# Patient Record
Sex: Female | Born: 1935 | Race: White | Hispanic: No | Smoking: Never smoker
Health system: Southern US, Community
[De-identification: ages and names within clinical notes are randomized; demographics above are authoritative.]

## PROBLEM LIST (undated history)

## (undated) DIAGNOSIS — M1990 Unspecified osteoarthritis, unspecified site: Secondary | ICD-10-CM

## (undated) DIAGNOSIS — F0280 Dementia in other diseases classified elsewhere without behavioral disturbance: Secondary | ICD-10-CM

## (undated) DIAGNOSIS — G309 Alzheimer's disease, unspecified: Secondary | ICD-10-CM

## (undated) DIAGNOSIS — H9190 Unspecified hearing loss, unspecified ear: Secondary | ICD-10-CM

## (undated) DIAGNOSIS — E785 Hyperlipidemia, unspecified: Secondary | ICD-10-CM

## (undated) DIAGNOSIS — E119 Type 2 diabetes mellitus without complications: Secondary | ICD-10-CM

## (undated) DIAGNOSIS — I517 Cardiomegaly: Secondary | ICD-10-CM

## (undated) DIAGNOSIS — F329 Major depressive disorder, single episode, unspecified: Secondary | ICD-10-CM

## (undated) DIAGNOSIS — N6019 Diffuse cystic mastopathy of unspecified breast: Secondary | ICD-10-CM

## (undated) DIAGNOSIS — I10 Essential (primary) hypertension: Secondary | ICD-10-CM

## (undated) DIAGNOSIS — F32A Depression, unspecified: Secondary | ICD-10-CM

## (undated) DIAGNOSIS — R42 Dizziness and giddiness: Secondary | ICD-10-CM

## (undated) DIAGNOSIS — N189 Chronic kidney disease, unspecified: Secondary | ICD-10-CM

## (undated) HISTORY — DX: Alzheimer's disease, unspecified: G30.9

## (undated) HISTORY — PX: CHOLECYSTECTOMY: SHX55

## (undated) HISTORY — DX: Unspecified hearing loss, unspecified ear: H91.90

## (undated) HISTORY — DX: Dementia in other diseases classified elsewhere, unspecified severity, without behavioral disturbance, psychotic disturbance, mood disturbance, and anxiety: F02.80

## (undated) HISTORY — PX: CATARACT EXTRACTION: SUR2

## (undated) HISTORY — DX: Unspecified osteoarthritis, unspecified site: M19.90

## (undated) HISTORY — DX: Hyperlipidemia, unspecified: E78.5

## (undated) HISTORY — PX: OTHER SURGICAL HISTORY: SHX169

## (undated) HISTORY — PX: CARPAL TUNNEL RELEASE: SHX101

## (undated) HISTORY — DX: Cardiomegaly: I51.7

## (undated) HISTORY — PX: BREAST SURGERY: SHX581

## (undated) HISTORY — DX: Chronic kidney disease, unspecified: N18.9

## (undated) HISTORY — DX: Diffuse cystic mastopathy of unspecified breast: N60.19

## (undated) HISTORY — PX: JOINT REPLACEMENT: SHX530

## (undated) HISTORY — PX: ABDOMINAL HYSTERECTOMY: SHX81

## (undated) HISTORY — DX: Dizziness and giddiness: R42

## (undated) HISTORY — DX: Essential (primary) hypertension: I10

---
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
## 2005-05-27 ENCOUNTER — Ambulatory Visit: Payer: Self-pay | Admitting: Specialist

## 2006-04-28 ENCOUNTER — Ambulatory Visit: Payer: Self-pay | Admitting: General Practice

## 2006-05-12 ENCOUNTER — Ambulatory Visit: Payer: Self-pay | Admitting: Internal Medicine

## 2006-06-10 ENCOUNTER — Ambulatory Visit: Payer: Self-pay | Admitting: General Practice

## 2006-06-10 ENCOUNTER — Other Ambulatory Visit: Payer: Self-pay

## 2006-06-23 ENCOUNTER — Ambulatory Visit: Payer: Self-pay | Admitting: General Practice

## 2006-07-06 ENCOUNTER — Encounter: Payer: Self-pay | Admitting: General Practice

## 2006-07-13 ENCOUNTER — Encounter: Payer: Self-pay | Admitting: General Practice

## 2006-08-13 ENCOUNTER — Encounter: Payer: Self-pay | Admitting: General Practice

## 2006-09-12 ENCOUNTER — Encounter: Payer: Self-pay | Admitting: General Practice

## 2006-12-22 ENCOUNTER — Ambulatory Visit: Payer: Self-pay | Admitting: Gastroenterology

## 2007-08-17 ENCOUNTER — Ambulatory Visit: Payer: Self-pay | Admitting: Internal Medicine

## 2008-01-18 ENCOUNTER — Ambulatory Visit: Payer: Self-pay | Admitting: Internal Medicine

## 2008-09-06 ENCOUNTER — Ambulatory Visit: Payer: Self-pay | Admitting: Surgery

## 2008-09-06 ENCOUNTER — Other Ambulatory Visit: Payer: Self-pay

## 2008-09-12 ENCOUNTER — Inpatient Hospital Stay: Payer: Self-pay | Admitting: Surgery

## 2008-10-17 ENCOUNTER — Ambulatory Visit: Payer: Self-pay | Admitting: Internal Medicine

## 2009-10-27 ENCOUNTER — Ambulatory Visit: Payer: Self-pay | Admitting: Internal Medicine

## 2009-11-19 ENCOUNTER — Ambulatory Visit: Payer: Self-pay | Admitting: Specialist

## 2010-10-28 ENCOUNTER — Ambulatory Visit: Payer: Self-pay | Admitting: Internal Medicine

## 2010-11-13 ENCOUNTER — Emergency Department: Payer: Self-pay | Admitting: Emergency Medicine

## 2011-11-11 ENCOUNTER — Ambulatory Visit: Payer: Self-pay | Admitting: Family Medicine

## 2012-04-14 ENCOUNTER — Encounter: Payer: Self-pay | Admitting: Internal Medicine

## 2012-08-17 ENCOUNTER — Encounter: Payer: Self-pay | Admitting: Unknown Physician Specialty

## 2012-09-12 ENCOUNTER — Encounter: Payer: Self-pay | Admitting: Unknown Physician Specialty

## 2012-10-05 ENCOUNTER — Encounter: Payer: Self-pay | Admitting: Physician Assistant

## 2012-10-13 ENCOUNTER — Encounter: Payer: Self-pay | Admitting: Physician Assistant

## 2015-05-28 ENCOUNTER — Telehealth: Payer: Self-pay

## 2015-05-28 NOTE — Telephone Encounter (Signed)
PT request is OK

## 2015-05-28 NOTE — Telephone Encounter (Signed)
Verbal orders called in 

## 2015-05-28 NOTE — Telephone Encounter (Signed)
Physical Therapist calling for verbal orders for PT Strength. Endurance, gait, balance, transferring 1wk x1 6wk x2  Call back [phone]

## 2015-08-05 DIAGNOSIS — I10 Essential (primary) hypertension: Secondary | ICD-10-CM | POA: Insufficient documentation

## 2015-08-05 DIAGNOSIS — E785 Hyperlipidemia, unspecified: Secondary | ICD-10-CM | POA: Insufficient documentation

## 2015-08-05 DIAGNOSIS — F0280 Dementia in other diseases classified elsewhere without behavioral disturbance: Secondary | ICD-10-CM | POA: Insufficient documentation

## 2015-08-05 DIAGNOSIS — G309 Alzheimer's disease, unspecified: Secondary | ICD-10-CM

## 2015-08-06 ENCOUNTER — Ambulatory Visit (INDEPENDENT_AMBULATORY_CARE_PROVIDER_SITE_OTHER): Payer: Medicare Other | Admitting: Family Medicine

## 2015-08-06 ENCOUNTER — Encounter: Payer: Self-pay | Admitting: Family Medicine

## 2015-08-06 VITALS — BP 160/77 | HR 71 | Temp 98.9°F | Ht 60.5 in | Wt 174.0 lb

## 2015-08-06 DIAGNOSIS — F0280 Dementia in other diseases classified elsewhere without behavioral disturbance: Secondary | ICD-10-CM

## 2015-08-06 DIAGNOSIS — G309 Alzheimer's disease, unspecified: Secondary | ICD-10-CM | POA: Diagnosis not present

## 2015-08-06 DIAGNOSIS — I10 Essential (primary) hypertension: Secondary | ICD-10-CM

## 2015-08-06 NOTE — Progress Notes (Signed)
BP 160/77 mmHg  Pulse 71  Temp(Src) 98.9 F (37.2 C)  Ht 5' 0.5" (1.537 m)  Wt 174 lb (78.926 kg)  BMI 33.41 kg/m2  SpO2 97%   Subjective:    Patient ID: Caroline Wilkerson, female    DOB: [birth year], 81 y.o.   MRN: [id number]  HPI: Caroline Wilkerson is a 81 y.o. female  No chief complaint on file.  patient accompanied by her daughter and son. Help assist with history. Daughter relates patient now having sundowning episodes marked confusion Patient otherwise doing stable has had a spell like confusion as noted above was checked for urinary tract infection and no signs or symptoms were found. Patient otherwise stable on current medications with no complaints.  Relevant past medical, surgical, family and social history reviewed and updated as indicated. Interim medical history since our last visit reviewed. Allergies and medications reviewed and updated.  Review of Systems  Constitutional: Negative.   Respiratory: Negative.   Cardiovascular: Negative.     Per HPI unless specifically indicated above     Objective:    BP 160/77 mmHg  Pulse 71  Temp(Src) 98.9 F (37.2 C)  Ht 5' 0.5" (1.537 m)  Wt 174 lb (78.926 kg)  BMI 33.41 kg/m2  SpO2 97%  Wt Readings from Last 3 Encounters:  08/06/15 174 lb (78.926 kg)  01/21/15 168 lb (76.204 kg)    Physical Exam  Constitutional: She is oriented to person, place, and time. She appears well-developed and well-nourished. No distress.  HENT:  Head: Normocephalic and atraumatic.  Right Ear: Hearing normal.  Left Ear: Hearing normal.  Nose: Nose normal.  Eyes: Conjunctivae and lids are normal. Right eye exhibits no discharge. Left eye exhibits no discharge. No scleral icterus.  Pulmonary/Chest: Effort normal. No respiratory distress.  Musculoskeletal: Normal range of motion.  Neurological: She is alert and oriented to person, place, and time.  Skin: Skin is intact. No rash noted.  Psychiatric: She has a normal mood and affect. Her  speech is normal and behavior is normal. Cognition and memory are normal.    Results for orders placed or performed in visit on 08/06/15  Basic metabolic panel  Result Value Ref Range   Glucose 98 65 - 99 mg/dL   BUN 31 (H) 8 - 27 mg/dL   Creatinine, Ser 5.63 (H) 0.57 - 1.00 mg/dL   GFR calc non Af Amer 46 (L) >59 mL/min/1.73   GFR calc Af Amer 53 (L) >59 mL/min/1.73   BUN/Creatinine Ratio 29 (H) 11 - 26   Sodium 139 134 - 144 mmol/L   Potassium 4.2 3.5 - 5.2 mmol/L   Chloride 97 97 - 108 mmol/L   CO2 27 18 - 29 mmol/L   Calcium 9.1 8.7 - 10.3 mg/dL      Assessment & Plan:   Problem List Items Addressed This Visit      Cardiovascular and Mediastinum   Hypertension - Primary    The current medical regimen is effective;  continue present plan and medications.       Relevant Orders   Basic metabolic panel (Completed)     Nervous and Auditory   Alzheimer's dementia    Discussed with family Support, natural progression, lack of adequate medications to control symptoms, but potential for medication anyway if needed.          Follow up plan: Return in about 6 months (around 02/06/2016), or if symptoms worsen or fail to improve.

## 2015-08-07 ENCOUNTER — Encounter: Payer: Self-pay | Admitting: Family Medicine

## 2015-08-07 LAB — BASIC METABOLIC PANEL
BUN/Creatinine Ratio: 29 — ABNORMAL HIGH (ref 11–26)
BUN: 31 mg/dL — AB (ref 8–27)
CO2: 27 mmol/L (ref 18–29)
CREATININE: 1.08 mg/dL — AB (ref 0.57–1.00)
Calcium: 9.1 mg/dL (ref 8.7–10.3)
Chloride: 97 mmol/L (ref 97–108)
GFR calc Af Amer: 53 mL/min/{1.73_m2} — ABNORMAL LOW (ref >59)
GFR calc non Af Amer: 46 mL/min/{1.73_m2} — ABNORMAL LOW (ref >59)
Glucose: 98 mg/dL (ref 65–99)
Potassium: 4.2 mmol/L (ref 3.5–5.2)
Sodium: 139 mmol/L (ref 134–144)

## 2015-08-07 NOTE — Assessment & Plan Note (Signed)
Discussed with family Support, natural progression, lack of adequate medications to control symptoms, but potential for medication anyway if needed.

## 2015-08-07 NOTE — Assessment & Plan Note (Signed)
The current medical regimen is effective;  continue present plan and medications.  

## 2015-08-22 ENCOUNTER — Telehealth: Payer: Self-pay | Admitting: Unknown Physician Specialty

## 2015-08-22 NOTE — Telephone Encounter (Signed)
Caroline Wilkerson from [facility] called stated she needs a verbal order for [facility] for 2 times a week for 3 weeks. Caroline Wilkerson needs this verbal order by the close of business today. Please call ASAP. Caroline Wilkerson pt, Caroline Wilkerson informed MAC out of the office until 09/01/15. Thanks.

## 2015-08-22 NOTE — Telephone Encounter (Signed)
I had called and left Caroline Wilkerson a voicemail for her to call me back as to why they are being sent out. Caroline Wilkerson returned my call and stated that they are being sent out to help the patient with walking, balance, and leg strengthening.

## 2015-08-22 NOTE — Telephone Encounter (Signed)
I can't do this as I have no diagnosis to cover home health.  Can the agency let us know what they are being sent out for?

## 2015-08-22 NOTE — Telephone Encounter (Signed)
I don't see in the chart where Dr. Dossie Arbour ordered this.  Did another dr order?

## 2015-08-26 ENCOUNTER — Telehealth: Payer: Self-pay | Admitting: Family Medicine

## 2015-08-26 NOTE — Telephone Encounter (Signed)
Could not continue therapy as we did not originate order.  Will see if MAC can make referral from last office visit with patient on 08/06/15.

## 2015-08-26 NOTE — Telephone Encounter (Signed)
Tresa Endo from [facility] called stated she needs a continuation order for physical therapy. This was originally ordered through [facility] with Dr. Carola Rhine. Pt is no longer a pt there since switching back to [facility]. Please call and notify if this order can be given. Thanks.

## 2015-09-01 NOTE — Telephone Encounter (Signed)
PT is okay 

## 2015-09-01 NOTE — Telephone Encounter (Signed)
Routing to Dr.Crissman 

## 2015-09-01 NOTE — Telephone Encounter (Signed)
Ok to order 

## 2015-10-09 ENCOUNTER — Telehealth: Payer: Self-pay

## 2015-10-09 MED ORDER — TRIAMTERENE-HCTZ 75-50 MG PO TABS: 1.0000 | ORAL_TABLET | Freq: Every day | ORAL | Status: DC

## 2015-10-09 MED ORDER — MIRABEGRON ER 25 MG PO TB24: 25.0000 mg | ORAL_TABLET | Freq: Every day | ORAL | Status: AC

## 2015-10-09 NOTE — Telephone Encounter (Signed)
[facility] requesting hardcopy Rx's for  Triameterene/Hydrochlorothiazide and [employer]  Fax [phone]

## 2015-10-15 ENCOUNTER — Telehealth: Payer: Self-pay | Admitting: Family Medicine

## 2015-10-15 NOTE — Telephone Encounter (Signed)
Yes just verbally call that in

## 2015-10-15 NOTE — Telephone Encounter (Signed)
Pt has developed a bed sore on her bottom and she would like to know if she can have a medicated bandage called in to [facility] in [place]

## 2015-10-16 ENCOUNTER — Telehealth: Payer: Self-pay | Admitting: Family Medicine

## 2015-10-16 NOTE — Telephone Encounter (Signed)
Pt.notified

## 2015-10-16 NOTE — Telephone Encounter (Signed)
[facility] [phone]

## 2015-10-16 NOTE — Telephone Encounter (Signed)
[facility] and the only thing they knew of was Duoderm which wouldn't be necessarily the best choice. They will check with the daughter and see if they know of something that they have used in the past.

## 2015-10-16 NOTE — Telephone Encounter (Signed)
[facility] and [facility] will not deliver there. pts [facility] [phone]) send to back up and it will be delivered within an hour.

## 2015-10-17 NOTE — Telephone Encounter (Signed)
Pt had pharmacy call to see if anything can be called in to help with bed sores, pt does not think Duoderm patches will work. Can something be sent in to help this pt. Please call and advise [facility] as to what they can dispense. Thanks.

## 2015-10-20 ENCOUNTER — Telehealth: Payer: Self-pay

## 2015-10-20 NOTE — Telephone Encounter (Signed)
pts daughter called and said the bed sore is still present and no patches had been called in and she is willing to accept the duoderm patches first to try them out.

## 2015-10-20 NOTE — Telephone Encounter (Signed)
Call in what for bed sore?

## 2015-10-21 ENCOUNTER — Ambulatory Visit (INDEPENDENT_AMBULATORY_CARE_PROVIDER_SITE_OTHER): Payer: Medicare Other | Admitting: Family Medicine

## 2015-10-21 ENCOUNTER — Telehealth: Payer: Self-pay | Admitting: Family Medicine

## 2015-10-21 ENCOUNTER — Encounter: Payer: Self-pay | Admitting: Family Medicine

## 2015-10-21 VITALS — BP 129/76 | HR 71 | Ht 59.6 in | Wt 176.0 lb

## 2015-10-21 DIAGNOSIS — L89301 Pressure ulcer of unspecified buttock, stage 1: Secondary | ICD-10-CM | POA: Insufficient documentation

## 2015-10-21 DIAGNOSIS — L89311 Pressure ulcer of right buttock, stage 1: Secondary | ICD-10-CM

## 2015-10-21 MED ORDER — DUODERM CGF DRESSING EX MISC: CUTANEOUS | Status: DC

## 2015-10-21 NOTE — Telephone Encounter (Signed)
[facility] WILL NEED TO MANAGE PTS BED SORE

## 2015-10-21 NOTE — Telephone Encounter (Signed)
If patient is Medicare, she will need face-to-face visit in order to order nurse visit Please call daughter and schedule appointment

## 2015-10-21 NOTE — Assessment & Plan Note (Signed)
Discussed care and treatment with patient's daughter We'll give Duoderm Home health nursing Patient education specialist to daughter about movement doughnut cushion and relieving pressure on this area to allow better circulation

## 2015-10-21 NOTE — Telephone Encounter (Signed)
Relayed MAC's reply, she will consider bringing patient in

## 2015-10-21 NOTE — Telephone Encounter (Signed)
Caroline Wilkerson would like to have nursing go out to evaluate bed sore(L hip/buttock)

## 2015-10-21 NOTE — Progress Notes (Signed)
BP 129/76 mmHg  Pulse 71  Ht 4' 11.6" (1.514 m)  Wt 176 lb (79.833 kg)  BMI 34.83 kg/m2  SpO2 96%   Subjective:    Patient ID: Caroline Wilkerson, female    DOB: 1923/12/04, 81 y.o.   MRN: [id number]  HPI: Caroline Wilkerson is a 81 y.o. female  Chief Complaint  Patient presents with  . bed sore    right side  . hand numbness    left   patient with Alzheimer's dementia sits a lot and is new in a nursing home. Has developed a slight abrasion like a pressure sore on right initial tuberosity area  Patient also complaining of some left hand numbness patient with Alzheimer's very difficult to obtain history patient's daughter is with her is not aware of any specific trauma or timing of problem Patient does have some left knee swelling from old trauma 2 weeks ago from where she fell perhaps striking her hand at that time  Relevant past medical, surgical, family and social history reviewed and updated as indicated. Interim medical history since our last visit reviewed. Allergies and medications reviewed and updated.  Review of Systems  Constitutional: Negative.   Respiratory: Negative.   Cardiovascular: Negative.     Per HPI unless specifically indicated above     Objective:    BP 129/76 mmHg  Pulse 71  Ht 4' 11.6" (1.514 m)  Wt 176 lb (79.833 kg)  BMI 34.83 kg/m2  SpO2 96%  Wt Readings from Last 3 Encounters:  10/21/15 176 lb (79.833 kg)  08/06/15 174 lb (78.926 kg)  01/21/15 168 lb (76.204 kg)    Physical Exam  Constitutional: She appears well-developed and well-nourished. No distress.  HENT:  Head: Normocephalic and atraumatic.  Right Ear: Hearing normal.  Left Ear: Hearing normal.  Nose: Nose normal.  Eyes: Conjunctivae and lids are normal. Right eye exhibits no discharge. Left eye exhibits no discharge. No scleral icterus.  Pulmonary/Chest: Effort normal. No respiratory distress.  Musculoskeletal: Normal range of motion.  Left knee effusion and tenderness left  hand is normal with normal sensation strength and grip will range of motion no tenderness  Neurological: She is alert.  Skin: Skin is intact. No rash noted.  Small abrasion like pressure sure right tibial tuberosity area  Psychiatric: She has a normal mood and affect. Her speech is normal and behavior is normal. Cognition and memory are normal.    Results for orders placed or performed in visit on 08/06/15  Basic metabolic panel  Result Value Ref Range   Glucose 98 65 - 99 mg/dL   BUN 31 (H) 8 - 27 mg/dL   Creatinine, Ser 1.32 (H) 0.57 - 1.00 mg/dL   GFR calc non Af Amer 46 (L) >59 mL/min/1.73   GFR calc Af Amer 53 (L) >59 mL/min/1.73   BUN/Creatinine Ratio 29 (H) 11 - 26   Sodium 139 134 - 144 mmol/L   Potassium 4.2 3.5 - 5.2 mmol/L   Chloride 97 97 - 108 mmol/L   CO2 27 18 - 29 mmol/L   Calcium 9.1 8.7 - 10.3 mg/dL      Assessment & Plan:   Problem List Items Addressed This Visit      Musculoskeletal and Integument   Decubitus ulcer of buttock, stage 1 - Primary    Discussed care and treatment with patient's daughter We'll give Duoderm Home health nursing Patient education specialist to daughter about movement doughnut cushion and relieving pressure on this  area to allow better circulation         For hand and the numbness will observe Follow up plan: Return in about 4 weeks (around 11/18/2015) for Recheck sooner if getting worse.

## 2015-11-18 ENCOUNTER — Other Ambulatory Visit: Payer: Medicare Other

## 2015-11-18 DIAGNOSIS — M545 Low back pain, unspecified: Secondary | ICD-10-CM

## 2015-11-18 DIAGNOSIS — R410 Disorientation, unspecified: Secondary | ICD-10-CM

## 2015-11-18 LAB — UA/M W/RFLX CULTURE, ROUTINE
BILIRUBIN UA: NEGATIVE
GLUCOSE, UA: NEGATIVE
Ketones, UA: NEGATIVE
Leukocytes, UA: NEGATIVE
Nitrite, UA: NEGATIVE
PH UA: 6 (ref 5.0–7.5)
Protein, UA: NEGATIVE
RBC UA: NEGATIVE
SPEC GRAV UA: 1.015 (ref 1.005–1.030)
UUROB: 0.2 mg/dL (ref 0.2–1.0)

## 2015-12-29 DIAGNOSIS — R2681 Unsteadiness on feet: Secondary | ICD-10-CM | POA: Diagnosis not present

## 2015-12-29 DIAGNOSIS — L600 Ingrowing nail: Secondary | ICD-10-CM | POA: Diagnosis not present

## 2015-12-29 DIAGNOSIS — B351 Tinea unguium: Secondary | ICD-10-CM | POA: Diagnosis not present

## 2015-12-29 DIAGNOSIS — M797 Fibromyalgia: Secondary | ICD-10-CM | POA: Diagnosis not present

## 2016-01-27 DIAGNOSIS — Z961 Presence of intraocular lens: Secondary | ICD-10-CM | POA: Diagnosis not present

## 2016-02-10 ENCOUNTER — Encounter: Payer: Self-pay | Admitting: Family Medicine

## 2016-02-10 ENCOUNTER — Ambulatory Visit (INDEPENDENT_AMBULATORY_CARE_PROVIDER_SITE_OTHER): Payer: Medicare HMO | Admitting: Family Medicine

## 2016-02-10 VITALS — BP 131/77 | HR 68 | Temp 97.6°F | Ht 60.0 in | Wt 175.0 lb

## 2016-02-10 DIAGNOSIS — F0280 Dementia in other diseases classified elsewhere without behavioral disturbance: Secondary | ICD-10-CM | POA: Diagnosis not present

## 2016-02-10 DIAGNOSIS — I10 Essential (primary) hypertension: Secondary | ICD-10-CM

## 2016-02-10 DIAGNOSIS — G309 Alzheimer's disease, unspecified: Secondary | ICD-10-CM | POA: Diagnosis not present

## 2016-02-10 DIAGNOSIS — R69 Illness, unspecified: Secondary | ICD-10-CM | POA: Diagnosis not present

## 2016-02-10 NOTE — Progress Notes (Signed)
BP 131/77 mmHg  Pulse 68  Temp(Src) 97.6 F (36.4 C)  Ht 5' (1.524 m)  Wt 175 lb (79.379 kg)  BMI 34.18 kg/m2  SpO2 93%   Subjective:    Patient ID: Caroline Wilkerson, female    DOB: [birth year], 82 y.o.   MRN: [id number]  HPI: Caroline Wilkerson is a 82 y.o. female  Chief Complaint  Patient presents with  . Hypertension  . Back Pain    chronic   patient here with son who provides history has really been doing well these last several weeks his back pains been less and has confusion been less area confusion gets worse with back pain getting worse. Patient had a fall about a month ago has been okay concerned about blood pressure being too low and blood pressure medication exacerbating fall risk.   Relevant past medical, surgical, family and social history reviewed and updated as indicated. Interim medical history since our last visit reviewed. Allergies and medications reviewed and updated.  Review of Systems  Constitutional: Negative.   Respiratory: Negative.   Cardiovascular: Negative.     Per HPI unless specifically indicated above     Objective:    BP 131/77 mmHg  Pulse 68  Temp(Src) 97.6 F (36.4 C)  Ht 5' (1.524 m)  Wt 175 lb (79.379 kg)  BMI 34.18 kg/m2  SpO2 93%  Wt Readings from Last 3 Encounters:  02/10/16 175 lb (79.379 kg)  10/21/15 176 lb (79.833 kg)  08/06/15 174 lb (78.926 kg)    Physical Exam  Constitutional: She is oriented to person, place, and time. She appears well-developed and well-nourished. No distress.  HENT:  Head: Normocephalic and atraumatic.  Right Ear: Hearing normal.  Left Ear: Hearing normal.  Nose: Nose normal.  Eyes: Conjunctivae and lids are normal. Right eye exhibits no discharge. Left eye exhibits no discharge. No scleral icterus.  Pulmonary/Chest: Effort normal. No respiratory distress.  Musculoskeletal: Normal range of motion.  Using a rolling walker  Neurological: She is alert and oriented to person, place, and time.   Skin: Skin is intact. No rash noted.  Psychiatric: She has a normal mood and affect. Her speech is normal and behavior is normal. Cognition and memory are normal.    Results for orders placed or performed in visit on 11/18/15  UA/M w/rflx Culture, Routine  Result Value Ref Range   Specific Gravity, UA 1.015 1.005 - 1.030   pH, UA 6.0 5.0 - 7.5   Color, UA Yellow Yellow   Appearance Ur Cloudy (A) Clear   Leukocytes, UA Negative Negative   Protein, UA Negative Negative/Trace   Glucose, UA Negative Negative   Ketones, UA Negative Negative   RBC, UA Negative Negative   Bilirubin, UA Negative Negative   Urobilinogen, Ur 0.2 0.2 - 1.0 mg/dL   Nitrite, UA Negative Negative      Assessment & Plan:   Problem List Items Addressed This Visit      Cardiovascular and Mediastinum   Hypertension - Primary    Good control of blood pressure here in the office today but concerned may be exacerbating risk factors will discontinue blood pressure medicines for now observe blood pressure if going too high will reconsider restarting medication or some other medication.        Nervous and Auditory   Alzheimer's dementia    Stable for now          Follow up plan: Return in about 2 months (around 04/09/2016).

## 2016-02-10 NOTE — Assessment & Plan Note (Signed)
Good control of blood pressure here in the office today but concerned may be exacerbating risk factors will discontinue blood pressure medicines for now observe blood pressure if going too high will reconsider restarting medication or some other medication.

## 2016-02-10 NOTE — Assessment & Plan Note (Signed)
Stable for now

## 2016-02-11 ENCOUNTER — Emergency Department
Admission: EM | Admit: 2016-02-11 | Discharge: 2016-02-11 | Disposition: A | Discharge status: Home / Self Care | Payer: Medicare HMO | Attending: Emergency Medicine | Admitting: Emergency Medicine

## 2016-02-11 ENCOUNTER — Emergency Department: Payer: Medicare HMO

## 2016-02-11 DIAGNOSIS — Y92128 Other place in nursing home as the place of occurrence of the external cause: Secondary | ICD-10-CM | POA: Diagnosis not present

## 2016-02-11 DIAGNOSIS — W01198A Fall on same level from slipping, tripping and stumbling with subsequent striking against other object, initial encounter: Secondary | ICD-10-CM | POA: Insufficient documentation

## 2016-02-11 DIAGNOSIS — Z791 Long term (current) use of non-steroidal anti-inflammatories (NSAID): Secondary | ICD-10-CM | POA: Diagnosis not present

## 2016-02-11 DIAGNOSIS — I129 Hypertensive chronic kidney disease with stage 1 through stage 4 chronic kidney disease, or unspecified chronic kidney disease: Secondary | ICD-10-CM | POA: Insufficient documentation

## 2016-02-11 DIAGNOSIS — F0280 Dementia in other diseases classified elsewhere without behavioral disturbance: Secondary | ICD-10-CM | POA: Diagnosis not present

## 2016-02-11 DIAGNOSIS — Z79899 Other long term (current) drug therapy: Secondary | ICD-10-CM | POA: Diagnosis not present

## 2016-02-11 DIAGNOSIS — N189 Chronic kidney disease, unspecified: Secondary | ICD-10-CM | POA: Insufficient documentation

## 2016-02-11 DIAGNOSIS — Y9389 Activity, other specified: Secondary | ICD-10-CM | POA: Diagnosis not present

## 2016-02-11 DIAGNOSIS — R69 Illness, unspecified: Secondary | ICD-10-CM | POA: Diagnosis not present

## 2016-02-11 DIAGNOSIS — S0990XA Unspecified injury of head, initial encounter: Secondary | ICD-10-CM

## 2016-02-11 DIAGNOSIS — S299XXA Unspecified injury of thorax, initial encounter: Secondary | ICD-10-CM | POA: Diagnosis not present

## 2016-02-11 DIAGNOSIS — S3993XA Unspecified injury of pelvis, initial encounter: Secondary | ICD-10-CM | POA: Diagnosis not present

## 2016-02-11 DIAGNOSIS — G309 Alzheimer's disease, unspecified: Secondary | ICD-10-CM | POA: Insufficient documentation

## 2016-02-11 DIAGNOSIS — Y998 Other external cause status: Secondary | ICD-10-CM | POA: Diagnosis not present

## 2016-02-11 DIAGNOSIS — W19XXXA Unspecified fall, initial encounter: Secondary | ICD-10-CM

## 2016-02-11 DIAGNOSIS — M6281 Muscle weakness (generalized): Secondary | ICD-10-CM | POA: Diagnosis not present

## 2016-02-11 DIAGNOSIS — S0003XA Contusion of scalp, initial encounter: Secondary | ICD-10-CM | POA: Diagnosis not present

## 2016-02-11 LAB — COMPREHENSIVE METABOLIC PANEL
ALK PHOS: 93 U/L (ref 38–126)
ALT: 13 U/L — AB (ref 14–54)
AST: 22 U/L (ref 15–41)
Albumin: 4.2 g/dL (ref 3.5–5.0)
Anion gap: 8 (ref 5–15)
BILIRUBIN TOTAL: 1.1 mg/dL (ref 0.3–1.2)
BUN: 21 mg/dL — ABNORMAL HIGH (ref 6–20)
CALCIUM: 9.3 mg/dL (ref 8.9–10.3)
CO2: 31 mmol/L (ref 22–32)
CREATININE: 0.88 mg/dL (ref 0.44–1.00)
Chloride: 101 mmol/L (ref 101–111)
GFR, EST NON AFRICAN AMERICAN: 56 mL/min — AB (ref >60)
Glucose, Bld: 104 mg/dL — ABNORMAL HIGH (ref 65–99)
Potassium: 3.2 mmol/L — ABNORMAL LOW (ref 3.5–5.1)
Sodium: 140 mmol/L (ref 135–145)
TOTAL PROTEIN: 7.4 g/dL (ref 6.5–8.1)

## 2016-02-11 LAB — URINALYSIS COMPLETE WITH MICROSCOPIC (ARMC ONLY)
BACTERIA UA: NONE SEEN
BILIRUBIN URINE: NEGATIVE
GLUCOSE, UA: NEGATIVE mg/dL
Hgb urine dipstick: NEGATIVE
KETONES UR: NEGATIVE mg/dL
LEUKOCYTES UA: NEGATIVE
Nitrite: NEGATIVE
Protein, ur: NEGATIVE mg/dL
Specific Gravity, Urine: 1.01 (ref 1.005–1.030)
pH: 6 (ref 5.0–8.0)

## 2016-02-11 LAB — CBC WITH DIFFERENTIAL/PLATELET
Basophils Absolute: 0.1 10*3/uL (ref 0–0.1)
Basophils Relative: 1 %
Eosinophils Absolute: 0.2 10*3/uL (ref 0–0.7)
Eosinophils Relative: 3 %
HCT: 39 % (ref 35.0–47.0)
HEMOGLOBIN: 12.8 g/dL (ref 12.0–16.0)
LYMPHS PCT: 21 %
Lymphs Abs: 1.6 10*3/uL (ref 1.0–3.6)
MCH: 30.2 pg (ref 26.0–34.0)
MCHC: 32.9 g/dL (ref 32.0–36.0)
MCV: 91.8 fL (ref 80.0–100.0)
MONOS PCT: 9 %
Monocytes Absolute: 0.7 10*3/uL (ref 0.2–0.9)
NEUTROS PCT: 66 %
Neutro Abs: 4.8 10*3/uL (ref 1.4–6.5)
Platelets: 198 10*3/uL (ref 150–440)
RBC: 4.25 MIL/uL (ref 3.80–5.20)
RDW: 13 % (ref 11.5–14.5)
WBC: 7.4 10*3/uL (ref 3.6–11.0)

## 2016-02-11 LAB — TROPONIN I

## 2016-02-11 MED ORDER — SODIUM CHLORIDE 0.9 % IV BOLUS (SEPSIS)
500.0000 mL | Freq: Once | INTRAVENOUS | Status: AC
  Administered 2016-02-11: 500 mL via INTRAVENOUS

## 2016-02-11 MED ORDER — ACETAMINOPHEN 325 MG PO TABS
650.0000 mg | ORAL_TABLET | Freq: Once | ORAL | Status: AC
  Administered 2016-02-11: 650 mg via ORAL
  Filled 2016-02-11: qty 2

## 2016-02-11 NOTE — ED Notes (Signed)
Pt presents via [facility] after a fall. Pt states she was going to the restroom and fell on the concrete floor, states she hit her head. No palpable hematoma felt. Pt alert and oriented at this time. Pt is not complaining of any pain. Pt states she does not know what made her fall.

## 2016-02-11 NOTE — ED Provider Notes (Signed)
[facility] Emergency Department Provider Note  ____________________________________________  Time seen: Approximately 5:33 AM  I have reviewed the triage vital signs and the nursing notes.   HISTORY  Chief Complaint Fall    HPI Caroline Wilkerson is a 82 y.o. female brought to the ED from nursing facility by EMS s/p unwitnessed fall. Patient reports she was ambulating to the restroom with her walker and fell onto the concrete floor, striking the back of her head. Denies LOC, dizziness, headache, nausea, vomiting, vision changes, neck pain. Patient unsure why she fell; unsure if she tripped. Denies recent fever, chills, cough, congestion, chest pain, shortness of breath, abdominal pain, vomiting, dysuria. Denies recent travel. Nothing makes her symptoms better or worse.   Past Medical History  Diagnosis Date  . Chronic kidney disease   . Vertigo   . Hypertension   . Hyperlipidemia   . Arthritis   . Alzheimer's dementia   . Cardiomegaly   . Hearing loss   . Fibrocystic breast     Patient Active Problem List   Diagnosis Date Noted  . Decubitus ulcer of buttock, stage 1 10/21/2015  . Hypertension   . Hyperlipidemia   . Alzheimer's dementia     Past Surgical History  Procedure Laterality Date  . Abdominal hysterectomy    . Cholecystectomy    . Breast surgery    . Joint replacement Right     knee and hip  . Rotator cuff surgery Bilateral   . Carpal tunnel release    . Cataract extraction Bilateral     Current Outpatient Rx  Name  Route  Sig  Dispense  Refill  . acetaminophen (TYLENOL) 500 MG tablet   Oral   Take 500 mg by mouth every 6 (six) hours as needed.         . calcium carbonate (OS-CAL) 600 MG TABS tablet   Oral   Take 600 mg by mouth 2 (two) times daily with a meal.         . meloxicam (MOBIC) 7.5 MG tablet   Oral   Take 7.5 mg by mouth 2 (two) times daily.         . mirabegron ER (MYRBETRIQ) 25 MG TB24 tablet   Oral  Take 1 tablet (25 mg total) by mouth daily.   30 tablet   6   . omeprazole (PRILOSEC) 20 MG capsule   Oral   Take 20 mg by mouth daily.           Allergies Cocaine and Codeine  Family History  Problem Relation Age of Onset  . Family history unknown: Yes    Social History Social History  Substance Use Topics  . Smoking status: Never Smoker   . Smokeless tobacco: Never Used  . Alcohol Use: No    Review of Systems  Constitutional: Positive for fall with scalp hematoma. No fever/chills. Eyes: No visual changes. ENT: No sore throat. Cardiovascular: Denies chest pain. Respiratory: Denies shortness of breath. Gastrointestinal: No abdominal pain.  No nausea, no vomiting.  No diarrhea.  No constipation. Genitourinary: Negative for dysuria. Musculoskeletal: Negative for back pain. Skin: Negative for rash. Neurological: Negative for headaches, focal weakness or numbness.  10-point ROS otherwise negative.  ____________________________________________   PHYSICAL EXAM:  VITAL SIGNS: ED Triage Vitals  Enc Vitals Group     BP 02/11/16 0439 169/85 mmHg     Pulse Rate 02/11/16 0439 60     Resp 02/11/16 0439 18  Temp 02/11/16 0439 98 F (36.7 C)     Temp src --      SpO2 02/11/16 0439 98 %     Weight 02/11/16 0439 190 lb 11.2 oz (86.5 kg)     Height 02/11/16 0439  (1.651 m)     Head Cir --      Peak Flow --      Pain Score 02/11/16 0440 0     Pain Loc --      Pain Edu? --      Excl. in GC? --     Constitutional: Alert and oriented. Well appearing and in no acute distress. Eyes: Conjunctivae are normal. PERRL. EOMI. Head: Atraumatic. Nose: No congestion/rhinnorhea. Mouth/Throat: Mucous membranes are moist.  Oropharynx non-erythematous. Neck: No stridor.  No cervical spine tenderness to palpation.  No step-offs or deformities noted. Cardiovascular: Normal rate, regular rhythm. II/VI SEM.  Good peripheral circulation. Respiratory: Normal respiratory  effort.  No retractions. Lungs CTAB. Gastrointestinal: Soft and nontender. No distention. No abdominal bruits. No CVA tenderness. Musculoskeletal: No lower extremity tenderness nor edema.  No joint effusions. Neurologic:  Normal speech and language. No gross focal neurologic deficits are appreciated. Gait not tested. Skin:  Skin is warm, dry and intact. No rash noted. Psychiatric: Mood and affect are normal. Speech and behavior are normal.  ____________________________________________   LABS (all labs ordered are listed, but only abnormal results are displayed)  Labs Reviewed  COMPREHENSIVE METABOLIC PANEL - Abnormal; Notable for the following:    Potassium 3.2 (*)    Glucose, Bld 104 (*)    BUN 21 (*)    ALT 13 (*)    GFR calc non Af Amer 56 (*)    All other components within normal limits  CBC WITH DIFFERENTIAL/PLATELET  TROPONIN I  URINALYSIS COMPLETEWITH MICROSCOPIC ([facility] ONLY)   ____________________________________________  EKG  ED ECG REPORT I, Ermalinda Joubert J, the attending physician, personally viewed and interpreted this ECG.   Date: 02/11/2016  EKG Time: 0554  Rate: 63  Rhythm: normal EKG, normal sinus rhythm  Axis: LAD  Intervals:right bundle branch block  ST&T Change:  nonspecific  ____________________________________________  RADIOLOGY  CT head without contrast interpreted per Dr. Cherly Hensen: 1. No evidence of traumatic intracranial injury or fracture.  2. Suggestion of mild soft tissue swelling at the left occiput. 3. Moderate cortical volume loss and scattered small vessel ischemic microangiopathy. 4. Prominent cavum septum pellucidum noted. 5. Inspissated mucus noted filling the left maxillary sinus  ____________________________________________   PROCEDURES  Procedure(s) performed: None  Critical Care performed: No  ____________________________________________   INITIAL IMPRESSION / ASSESSMENT AND PLAN / ED COURSE  Pertinent labs & imaging  results that were available during my care of the patient were reviewed by me and considered in my medical decision making (see chart for details).  82 year old female who presents with occipital scalp hematoma s/p unwitnessed fall with unclear mechanism. Will obtain screening lab work, EKG, urinalysis and imaging studies of head, chest and pelvis.  ----------------------------------------- 8:06 AM on 02/11/2016 -----------------------------------------  Updated patient and family members of imaging results. Awaiting laboratory results and urine specimen. Patient ambulated to commode with steady gait, but felt to obtain a specimen. IV fluids infusing; patient will attempt a second time. Anticipate discharge to her facility pending laboratory and urinalysis results. Care transferred to Dr. Alphonzo Lemmings. ____________________________________________   FINAL CLINICAL IMPRESSION(S) / ED DIAGNOSES  Final diagnoses:  Fall, initial encounter  Scalp contusion, initial encounter  Head injury, initial encounter  Irean Hong, MD 02/11/16 [phone]

## 2016-02-11 NOTE — ED Notes (Signed)
Dr. Sung at bedside.  

## 2016-02-11 NOTE — ED Notes (Signed)
Family at bedside. 

## 2016-02-11 NOTE — Discharge Instructions (Signed)
Return to the ER for worsening symptoms, persistent vomiting, difficulty breathing or other concerns. °

## 2016-02-11 NOTE — ED Notes (Signed)
Pt transported to CT via stretcher.  

## 2016-02-17 ENCOUNTER — Inpatient Hospital Stay
Admission: EM | Admit: 2016-02-17 | Discharge: 2016-02-20 | DRG: 603 | Disposition: A | Discharge status: Home Health | Payer: Medicare HMO | Attending: Internal Medicine | Admitting: Internal Medicine

## 2016-02-17 ENCOUNTER — Encounter: Payer: Self-pay | Admitting: Unknown Physician Specialty

## 2016-02-17 ENCOUNTER — Ambulatory Visit (INDEPENDENT_AMBULATORY_CARE_PROVIDER_SITE_OTHER): Payer: Medicare HMO | Admitting: Unknown Physician Specialty

## 2016-02-17 ENCOUNTER — Encounter: Payer: Self-pay | Admitting: Emergency Medicine

## 2016-02-17 VITALS — BP 111/73 | HR 96 | Temp 98.6°F | Wt 175.0 lb

## 2016-02-17 DIAGNOSIS — Z792 Long term (current) use of antibiotics: Secondary | ICD-10-CM | POA: Diagnosis not present

## 2016-02-17 DIAGNOSIS — Z96651 Presence of right artificial knee joint: Secondary | ICD-10-CM | POA: Diagnosis present

## 2016-02-17 DIAGNOSIS — L0390 Cellulitis, unspecified: Secondary | ICD-10-CM

## 2016-02-17 DIAGNOSIS — G309 Alzheimer's disease, unspecified: Secondary | ICD-10-CM | POA: Diagnosis present

## 2016-02-17 DIAGNOSIS — L03111 Cellulitis of right axilla: Secondary | ICD-10-CM | POA: Diagnosis present

## 2016-02-17 DIAGNOSIS — Y92129 Unspecified place in nursing home as the place of occurrence of the external cause: Secondary | ICD-10-CM | POA: Diagnosis not present

## 2016-02-17 DIAGNOSIS — L03113 Cellulitis of right upper limb: Principal | ICD-10-CM | POA: Diagnosis present

## 2016-02-17 DIAGNOSIS — L02411 Cutaneous abscess of right axilla: Secondary | ICD-10-CM | POA: Diagnosis not present

## 2016-02-17 DIAGNOSIS — W19XXXA Unspecified fall, initial encounter: Secondary | ICD-10-CM | POA: Diagnosis not present

## 2016-02-17 DIAGNOSIS — Z96641 Presence of right artificial hip joint: Secondary | ICD-10-CM | POA: Diagnosis present

## 2016-02-17 DIAGNOSIS — R531 Weakness: Secondary | ICD-10-CM

## 2016-02-17 DIAGNOSIS — B9562 Methicillin resistant Staphylococcus aureus infection as the cause of diseases classified elsewhere: Secondary | ICD-10-CM | POA: Diagnosis not present

## 2016-02-17 DIAGNOSIS — Z8249 Family history of ischemic heart disease and other diseases of the circulatory system: Secondary | ICD-10-CM

## 2016-02-17 DIAGNOSIS — R51 Headache: Secondary | ICD-10-CM | POA: Diagnosis not present

## 2016-02-17 DIAGNOSIS — S0001XA Abrasion of scalp, initial encounter: Secondary | ICD-10-CM | POA: Diagnosis not present

## 2016-02-17 DIAGNOSIS — S0003XA Contusion of scalp, initial encounter: Secondary | ICD-10-CM | POA: Diagnosis not present

## 2016-02-17 DIAGNOSIS — Z791 Long term (current) use of non-steroidal anti-inflammatories (NSAID): Secondary | ICD-10-CM | POA: Diagnosis not present

## 2016-02-17 DIAGNOSIS — N189 Chronic kidney disease, unspecified: Secondary | ICD-10-CM | POA: Diagnosis present

## 2016-02-17 DIAGNOSIS — Z79899 Other long term (current) drug therapy: Secondary | ICD-10-CM | POA: Diagnosis not present

## 2016-02-17 DIAGNOSIS — E876 Hypokalemia: Secondary | ICD-10-CM | POA: Diagnosis present

## 2016-02-17 DIAGNOSIS — I517 Cardiomegaly: Secondary | ICD-10-CM | POA: Diagnosis not present

## 2016-02-17 DIAGNOSIS — I129 Hypertensive chronic kidney disease with stage 1 through stage 4 chronic kidney disease, or unspecified chronic kidney disease: Secondary | ICD-10-CM | POA: Diagnosis not present

## 2016-02-17 DIAGNOSIS — M1990 Unspecified osteoarthritis, unspecified site: Secondary | ICD-10-CM | POA: Diagnosis present

## 2016-02-17 DIAGNOSIS — N6019 Diffuse cystic mastopathy of unspecified breast: Secondary | ICD-10-CM | POA: Diagnosis present

## 2016-02-17 DIAGNOSIS — E785 Hyperlipidemia, unspecified: Secondary | ICD-10-CM | POA: Diagnosis not present

## 2016-02-17 DIAGNOSIS — Z96649 Presence of unspecified artificial hip joint: Secondary | ICD-10-CM | POA: Diagnosis not present

## 2016-02-17 DIAGNOSIS — S199XXA Unspecified injury of neck, initial encounter: Secondary | ICD-10-CM | POA: Diagnosis not present

## 2016-02-17 DIAGNOSIS — F0280 Dementia in other diseases classified elsewhere without behavioral disturbance: Secondary | ICD-10-CM | POA: Diagnosis present

## 2016-02-17 DIAGNOSIS — H9190 Unspecified hearing loss, unspecified ear: Secondary | ICD-10-CM | POA: Diagnosis present

## 2016-02-17 DIAGNOSIS — Y998 Other external cause status: Secondary | ICD-10-CM | POA: Diagnosis not present

## 2016-02-17 DIAGNOSIS — R69 Illness, unspecified: Secondary | ICD-10-CM | POA: Diagnosis not present

## 2016-02-17 DIAGNOSIS — L732 Hidradenitis suppurativa: Secondary | ICD-10-CM | POA: Diagnosis present

## 2016-02-17 DIAGNOSIS — I10 Essential (primary) hypertension: Secondary | ICD-10-CM | POA: Diagnosis not present

## 2016-02-17 DIAGNOSIS — Z885 Allergy status to narcotic agent status: Secondary | ICD-10-CM | POA: Diagnosis not present

## 2016-02-17 DIAGNOSIS — Y9389 Activity, other specified: Secondary | ICD-10-CM | POA: Diagnosis not present

## 2016-02-17 DIAGNOSIS — Z888 Allergy status to other drugs, medicaments and biological substances status: Secondary | ICD-10-CM

## 2016-02-17 DIAGNOSIS — Q659 Congenital deformity of hip, unspecified: Secondary | ICD-10-CM | POA: Diagnosis not present

## 2016-02-17 DIAGNOSIS — Z833 Family history of diabetes mellitus: Secondary | ICD-10-CM | POA: Diagnosis not present

## 2016-02-17 DIAGNOSIS — S0990XA Unspecified injury of head, initial encounter: Secondary | ICD-10-CM | POA: Diagnosis not present

## 2016-02-17 DIAGNOSIS — W01198A Fall on same level from slipping, tripping and stumbling with subsequent striking against other object, initial encounter: Secondary | ICD-10-CM | POA: Diagnosis not present

## 2016-02-17 LAB — CBC
HCT: 41.2 % (ref 35.0–47.0)
Hemoglobin: 13.9 g/dL (ref 12.0–16.0)
MCH: 30.6 pg (ref 26.0–34.0)
MCHC: 33.8 g/dL (ref 32.0–36.0)
MCV: 90.5 fL (ref 80.0–100.0)
PLATELETS: 189 10*3/uL (ref 150–440)
RBC: 4.55 MIL/uL (ref 3.80–5.20)
RDW: 12.9 % (ref 11.5–14.5)
WBC: 18.6 10*3/uL — ABNORMAL HIGH (ref 3.6–11.0)

## 2016-02-17 LAB — URINALYSIS COMPLETE WITH MICROSCOPIC (ARMC ONLY)
BACTERIA UA: NONE SEEN
Bilirubin Urine: NEGATIVE
Glucose, UA: NEGATIVE mg/dL
HGB URINE DIPSTICK: NEGATIVE
Ketones, ur: NEGATIVE mg/dL
LEUKOCYTES UA: NEGATIVE
NITRITE: NEGATIVE
PH: 6 (ref 5.0–8.0)
PROTEIN: 30 mg/dL — AB
SPECIFIC GRAVITY, URINE: 1.02 (ref 1.005–1.030)

## 2016-02-17 LAB — BASIC METABOLIC PANEL
Anion gap: 11 (ref 5–15)
BUN: 26 mg/dL — ABNORMAL HIGH (ref 6–20)
CALCIUM: 9.4 mg/dL (ref 8.9–10.3)
CO2: 28 mmol/L (ref 22–32)
CREATININE: 0.94 mg/dL (ref 0.44–1.00)
Chloride: 98 mmol/L — ABNORMAL LOW (ref 101–111)
GFR, EST NON AFRICAN AMERICAN: 52 mL/min — AB (ref >60)
Glucose, Bld: 126 mg/dL — ABNORMAL HIGH (ref 65–99)
Potassium: 3.4 mmol/L — ABNORMAL LOW (ref 3.5–5.1)
SODIUM: 137 mmol/L (ref 135–145)

## 2016-02-17 LAB — MRSA PCR SCREENING: MRSA BY PCR: POSITIVE — AB

## 2016-02-17 LAB — LACTIC ACID, PLASMA
LACTIC ACID, VENOUS: 1.8 mmol/L (ref 0.5–2.0)
LACTIC ACID, VENOUS: 0.9 mmol/L (ref 0.5–2.0)

## 2016-02-17 MED ORDER — SODIUM CHLORIDE 0.9 % IV BOLUS (SEPSIS)
1000.0000 mL | Freq: Once | INTRAVENOUS | Status: AC
  Administered 2016-02-17: 1000 mL via INTRAVENOUS

## 2016-02-17 MED ORDER — ACETAMINOPHEN 500 MG PO TABS: 500.0000 mg | ORAL_TABLET | Freq: Four times a day (QID) | ORAL | Status: DC | PRN

## 2016-02-17 MED ORDER — CHLORHEXIDINE GLUCONATE CLOTH 2 % EX PADS
6.0000 | MEDICATED_PAD | Freq: Every day | CUTANEOUS | Status: DC
  Administered 2016-02-18 – 2016-02-20 (×3): 6 via TOPICAL

## 2016-02-17 MED ORDER — MIRABEGRON ER 25 MG PO TB24
25.0000 mg | ORAL_TABLET | Freq: Every day | ORAL | Status: DC
  Administered 2016-02-17 – 2016-02-20 (×4): 25 mg via ORAL
  Filled 2016-02-17 (×5): qty 1

## 2016-02-17 MED ORDER — ACETAMINOPHEN 325 MG PO TABS
650.0000 mg | ORAL_TABLET | Freq: Four times a day (QID) | ORAL | Status: DC | PRN
  Administered 2016-02-17: 650 mg via ORAL
  Filled 2016-02-17: qty 2

## 2016-02-17 MED ORDER — MUPIROCIN 2 % EX OINT
1.0000 "application " | TOPICAL_OINTMENT | Freq: Two times a day (BID) | CUTANEOUS | Status: DC
  Administered 2016-02-17 – 2016-02-20 (×6): 1 via NASAL
  Filled 2016-02-17: qty 22

## 2016-02-17 MED ORDER — CLINDAMYCIN PHOSPHATE 600 MG/50ML IV SOLN
600.0000 mg | Freq: Once | INTRAVENOUS | Status: AC
  Administered 2016-02-17: 600 mg via INTRAVENOUS
  Filled 2016-02-17: qty 50

## 2016-02-17 MED ORDER — CALCIUM CARBONATE ANTACID 500 MG PO CHEW
500.0000 mg | CHEWABLE_TABLET | Freq: Two times a day (BID) | ORAL | Status: DC
  Administered 2016-02-17 – 2016-02-20 (×6): 500 mg via ORAL
  Filled 2016-02-17 (×7): qty 1

## 2016-02-17 MED ORDER — PANTOPRAZOLE SODIUM 40 MG PO TBEC
40.0000 mg | DELAYED_RELEASE_TABLET | Freq: Every day | ORAL | Status: DC
  Administered 2016-02-17 – 2016-02-20 (×4): 40 mg via ORAL
  Filled 2016-02-17 (×4): qty 1

## 2016-02-17 MED ORDER — HEPARIN SODIUM (PORCINE) 5000 UNIT/ML IJ SOLN
5000.0000 [IU] | Freq: Three times a day (TID) | INTRAMUSCULAR | Status: DC
  Administered 2016-02-17 – 2016-02-18 (×3): 5000 [IU] via SUBCUTANEOUS
  Filled 2016-02-17 (×3): qty 1

## 2016-02-17 MED ORDER — SODIUM CHLORIDE 0.9 % IV SOLN
1.5000 g | Freq: Three times a day (TID) | INTRAVENOUS | Status: DC
  Administered 2016-02-17 – 2016-02-19 (×6): 1.5 g via INTRAVENOUS
  Filled 2016-02-17 (×10): qty 1.5

## 2016-02-17 MED ORDER — MELOXICAM 7.5 MG PO TABS
7.5000 mg | ORAL_TABLET | Freq: Two times a day (BID) | ORAL | Status: DC
  Administered 2016-02-17 – 2016-02-18 (×3): 7.5 mg via ORAL
  Filled 2016-02-17 (×4): qty 1

## 2016-02-17 NOTE — Progress Notes (Signed)
BP 111/73 mmHg  Pulse 96  Temp(Src) 98.6 F (37 C)  Wt 175 lb (79.379 kg)  SpO2 93%   Subjective:    Patient ID: Caroline Wilkerson, female    DOB: 08-12-1923, 82 y.o.   MRN: [id number]  HPI: Caroline NancyDoris K Hanlon is a 82 y.o. female  Chief Complaint  Patient presents with  . Fall    Patient has fell twice. Went to hospital, but everything was fine. Very weak in legs.   . Recurrent Skin Infections    Under the right arm   Pt is here with both son and daughter while in a wheelchair    Cellulitis Pt with cellulitis under the right arm.  This was noticed yesterday after complaints of it being sore.    Weakness Pt with weakness bilateral legs.  Larey SeatFell backwards 1 week ago, hit her head, and was checked at the ER.  Checked out OK.  Weakness noted just yesterday and fell again as getting up to the bathroom by herself.  Seems like she "sat down."  No increased confusion.  Possibly mild changes in behavior  Relevant past medical, surgical, family and social history reviewed and updated as indicated. Interim medical history since our last visit reviewed. Allergies and medications reviewed and updated.  Review of Systems  Per HPI unless specifically indicated above     Objective:    BP 111/73 mmHg  Pulse 96  Temp(Src) 98.6 F (37 C)  Wt 175 lb (79.379 kg)  SpO2 93%  Wt Readings from Last 3 Encounters:  02/17/16 175 lb (79.379 kg)  02/11/16 190 lb 11.2 oz (86.5 kg)  02/10/16 175 lb (79.379 kg)    Physical Exam  Constitutional: She appears well-developed and well-nourished.  Neck: Normal range of motion.  Cardiovascular: Normal rate and normal heart sounds.  Exam reveals no gallop and no friction rub.   No murmur heard. Pulmonary/Chest: Effort normal and breath sounds normal. No respiratory distress.  Musculoskeletal:       Right shoulder: She exhibits decreased strength.  1 plus strength equal bilaterally  Skin: Skin is warm and dry. There is erythema.  Abcess under right arm  with associated erythema in surrounding soft tissues    Results for orders placed or performed during the hospital encounter of 02/11/16  CBC with Differential  Result Value Ref Range   WBC 7.4 3.6 - 11.0 K/uL   RBC 4.25 3.80 - 5.20 MIL/uL   Hemoglobin 12.8 12.0 - 16.0 g/dL   HCT 08.639.0 57.835.0 - 46.947.0 %   MCV 91.8 80.0 - 100.0 fL   MCH 30.2 26.0 - 34.0 pg   MCHC 32.9 32.0 - 36.0 g/dL   RDW 62.913.0 52.811.5 - 41.314.5 %   Platelets 198 150 - 440 K/uL   Neutrophils Relative % 66 %   Neutro Abs 4.8 1.4 - 6.5 K/uL   Lymphocytes Relative 21 %   Lymphs Abs 1.6 1.0 - 3.6 K/uL   Monocytes Relative 9 %   Monocytes Absolute 0.7 0.2 - 0.9 K/uL   Eosinophils Relative 3 %   Eosinophils Absolute 0.2 0 - 0.7 K/uL   Basophils Relative 1 %   Basophils Absolute 0.1 0 - 0.1 K/uL  Comprehensive metabolic panel  Result Value Ref Range   Sodium 140 135 - 145 mmol/L   Potassium 3.2 (L) 3.5 - 5.1 mmol/L   Chloride 101 101 - 111 mmol/L   CO2 31 22 - 32 mmol/L   Glucose, Bld 104 (H)  65 - 99 mg/dL   BUN 21 (H) 6 - 20 mg/dL   Creatinine, Ser 1.91 0.44 - 1.00 mg/dL   Calcium 9.3 8.9 - 47.8 mg/dL   Total Protein 7.4 6.5 - 8.1 g/dL   Albumin 4.2 3.5 - 5.0 g/dL   AST 22 15 - 41 U/L   ALT 13 (L) 14 - 54 U/L   Alkaline Phosphatase 93 38 - 126 U/L   Total Bilirubin 1.1 0.3 - 1.2 mg/dL   GFR calc non Af Amer 56 (L) >60 mL/min   GFR calc Af Amer >60 >60 mL/min   Anion gap 8 5 - 15  Troponin I  Result Value Ref Range   Troponin I <0.03 <0.031 ng/mL  Urinalysis complete, with microscopic ([facility] only)  Result Value Ref Range   Color, Urine YELLOW (A) YELLOW   APPearance HAZY (A) CLEAR   Glucose, UA NEGATIVE NEGATIVE mg/dL   Bilirubin Urine NEGATIVE NEGATIVE   Ketones, ur NEGATIVE NEGATIVE mg/dL   Specific Gravity, Urine 1.010 1.005 - 1.030   Hgb urine dipstick NEGATIVE NEGATIVE   pH 6.0 5.0 - 8.0   Protein, ur NEGATIVE NEGATIVE mg/dL   Nitrite NEGATIVE NEGATIVE   Leukocytes, UA NEGATIVE NEGATIVE   RBC / HPF 0-5 0  - 5 RBC/hpf   WBC, UA 0-5 0 - 5 WBC/hpf   Bacteria, UA NONE SEEN NONE SEEN   Squamous Epithelial / LPF 0-5 (A) NONE SEEN      Assessment & Plan:   Problem List Items Addressed This Visit    None    Visit Diagnoses    Cellulitis of right axilla    -  Primary    Weakness           Suspect weakness related to cellulitis.  Discussed pt with Dr. Dossie Arbour who examined pt.  Will refer to ER for further evaluations due to safety concerns.    Follow up plan: Return for In the ER.

## 2016-02-17 NOTE — NC FL2 (Signed)
  [place] MEDICAID FL2 LEVEL OF CARE SCREENING TOOL     IDENTIFICATION  Patient Name: Caroline NancyDoris K Baxendale Birthdate: [birth year] Sex: female Admission Date (Current Location): 02/17/2016  [facility] [place] [place]   Facility and Address:  [facility], [address], [place], [place] [address]      Provider Number: [id number]  Attending Physician Name and Address:  Altamese DillingVaibhavkumar Vachhani, MD  Relative Name and Phone Number:       Current Level of Care: Hospital Recommended Level of Care: Assisted Living Facility Prior Approval Number:    Date Approved/Denied:   PASRR Number:  [id number])  Discharge Plan: Domiciliary (Rest home)    Current Diagnoses: Patient Active Problem List   Diagnosis Date Noted  . Cellulitis 02/17/2016  . Generalized weakness 02/17/2016  . Decubitus ulcer of buttock, stage 1 10/21/2015  . Hypertension   . Hyperlipidemia   . Alzheimer's dementia     Orientation RESPIRATION BLADDER Height & Weight     Self, Place  Normal Incontinent Weight: 175 lb (79.379 kg) Height:  5\' 1"  (154.9 cm)  BEHAVIORAL SYMPTOMS/MOOD NEUROLOGICAL BOWEL NUTRITION STATUS   (None)  (None) Incontinent Diet (Heart)  AMBULATORY STATUS COMMUNICATION OF NEEDS Skin   Limited Assist Verbally Normal                       Personal Care Assistance Level of Assistance  Feeding, Dressing, Bathing Bathing Assistance: Limited assistance Feeding assistance: Independent       Functional Limitations Info  Sight, Hearing, Speech Sight Info: Adequate Hearing Info: Adequate Speech Info: Adequate    SPECIAL CARE FACTORS FREQUENCY                       Contractures      Additional Factors Info  Code Status, Allergies Code Status Info:  (Full Code) Allergies Info:  (Codeine)           Current Medications (02/17/2016):  This is the current hospital active medication list Current Facility-Administered Medications  Medication  Dose Route Frequency Provider Last Rate Last Dose  . acetaminophen (TYLENOL) tablet 500 mg  500 mg Oral Q6H PRN Altamese DillingVaibhavkumar Vachhani, MD      . acetaminophen (TYLENOL) tablet 650 mg  650 mg Oral Q6H PRN Altamese DillingVaibhavkumar Vachhani, MD   650 mg at 02/17/16 1539  . ampicillin-sulbactam (UNASYN) 1.5 g in sodium chloride 0.9 % 50 mL IVPB  1.5 g Intravenous Q8H Altamese DillingVaibhavkumar Vachhani, MD   1.5 g at 02/17/16 1535  . calcium carbonate (OS-CAL) tablet 600 mg  600 mg Oral BID WC Altamese DillingVaibhavkumar Vachhani, MD      . heparin injection 5,000 Units  5,000 Units Subcutaneous 3 times per day Altamese DillingVaibhavkumar Vachhani, MD      . meloxicam (MOBIC) tablet 7.5 mg  7.5 mg Oral BID Altamese DillingVaibhavkumar Vachhani, MD      . mirabegron ER (MYRBETRIQ) tablet 25 mg  25 mg Oral Daily Altamese DillingVaibhavkumar Vachhani, MD      . pantoprazole (PROTONIX) EC tablet 40 mg  40 mg Oral Daily Altamese DillingVaibhavkumar Vachhani, MD         Discharge Medications: Please see discharge summary for a list of discharge medications.  Relevant Imaging Results:  Relevant Lab Results:   Additional Information  (SSN [id number])  Verta Ellenhristina E Viveka Wilmeth, LCSW

## 2016-02-17 NOTE — ED Provider Notes (Signed)
[facility] Emergency Department Provider Note   ____________________________________________  Time seen: Approximately 11:30 AM I have reviewed the triage vital signs and the triage nursing note.  HISTORY  Chief Complaint Weakness and Abscess   Historian Patient  HPI Caroline Wilkerson is a 82 y.o. female who [facility] and sent to the ED for evaluation of abscess/cellulitis under the right arm. Patient states that she noticed it yesterday, and told her family today. She's also had extreme generalized weakness, difficulty standing due to generalized weakness. No focal weakness. No headache, nausea, or diarrhea. No abdominal pain. No fever. No trouble breathing. Her skin infections. No history of cancers.    Past Medical History  Diagnosis Date  . Chronic kidney disease   . Vertigo   . Hypertension   . Hyperlipidemia   . Arthritis   . Alzheimer's dementia   . Cardiomegaly   . Hearing loss   . Fibrocystic breast     Patient Active Problem List   Diagnosis Date Noted  . Decubitus ulcer of buttock, stage 1 10/21/2015  . Hypertension   . Hyperlipidemia   . Alzheimer's dementia     Past Surgical History  Procedure Laterality Date  . Abdominal hysterectomy    . Cholecystectomy    . Breast surgery    . Joint replacement Right     knee and hip  . Rotator cuff surgery Bilateral   . Carpal tunnel release    . Cataract extraction Bilateral     Current Outpatient Rx  Name  Route  Sig  Dispense  Refill  . acetaminophen (TYLENOL) 500 MG tablet   Oral   Take 500 mg by mouth every 6 (six) hours as needed for moderate pain.          . calcium carbonate (OS-CAL) 600 MG TABS tablet   Oral   Take 600 mg by mouth 2 (two) times daily with a meal.         . meloxicam (MOBIC) 7.5 MG tablet   Oral   Take 7.5 mg by mouth 2 (two) times daily.         . mirabegron ER (MYRBETRIQ) 25 MG TB24 tablet   Oral   Take 1 tablet (25  mg total) by mouth daily.   30 tablet   6   . omeprazole (PRILOSEC) 20 MG capsule   Oral   Take 20 mg by mouth daily.           Allergies Codeine  Family History  Problem Relation Age of Onset  . Family history unknown: Yes    Social History Social History  Substance Use Topics  . Smoking status: Never Smoker   . Smokeless tobacco: Never Used  . Alcohol Use: No    Review of Systems  Constitutional: Negative for fever. Eyes: Negative for visual changes. ENT: Negative for sore throat. Cardiovascular: Negative for chest pain. Respiratory: Negative for shortness of breath. Gastrointestinal: Negative for abdominal pain, vomiting and diarrhea. Genitourinary: Negative for dysuria. Musculoskeletal: Negative for back pain. Skin: as per hpi. Neurological: Negative for headache. 10 point Review of Systems otherwise negative ____________________________________________   PHYSICAL EXAM:  VITAL SIGNS: ED Triage Vitals  Enc Vitals Group     BP 02/17/16 1018 115/62 mmHg     Pulse Rate 02/17/16 1018 89     Resp 02/17/16 1018 16     Temp 02/17/16 1018 101.2 F (38.4 C)     Temp Source  02/17/16 1110 Oral     SpO2 02/17/16 1018 92 %     Weight 02/17/16 1015 175 lb (79.379 kg)     Height 02/17/16 1015  (1.549 m)     Head Cir --      Peak Flow --      Pain Score --      Pain Loc --      Pain Edu? --      Excl. in GC? --      Constitutional: Alert and oriented. Well appearing and in no distress. HEENT   Head: Normocephalic and atraumatic.      Eyes: Conjunctivae are normal. PERRL. Normal extraocular movements.      Ears:         Nose: No congestion/rhinnorhea.   Mouth/Throat: Mucous membranes are moist.   Neck: No stridor. Cardiovascular/Chest: Normal rate, regular rhythm.  No murmurs, rubs, or gallops. Respiratory: Normal respiratory effort without tachypnea nor retractions. Breath sounds are clear and equal bilaterally. No  wheezes/rales/rhonchi. Gastrointestinal: Soft. No distention, no guarding, no rebound. Nontender.    Genitourinary/rectal:Deferred Musculoskeletal: Nontender with normal range of motion in all extremities. No joint effusions.  No lower extremity tenderness.  No edema. Neurologic:  Normal speech and language. No gross or focal neurologic deficits are appreciated. Skin:  Small draining abscess right armpit with surrounding area of induration without any additional area of fluctuance. Very large area of cellulitis starting at the armpit and extending up the arm and down the body. Psychiatric: Mood and affect are normal. Speech and behavior are normal. Patient exhibits appropriate insight and judgment.  ____________________________________________   EKG I, Governor Rooks, MD, the attending physician have personally viewed and interpreted all ECGs.  None ____________________________________________  LABS (pertinent positives/negatives)  White blood count 18.6, hemoglobin 13.9 and platelet count 189 Basic metabolic panel significant for potassium 3.4, BUN 26 and creatinine 0.94 Lactate pending Urinalysis pending  ____________________________________________  RADIOLOGY All Xrays were viewed by me. Imaging interpreted by Radiologist.  None __________________________________________  PROCEDURES  Procedure(s) performed: None  Critical Care performed: None  ____________________________________________   ED COURSE / ASSESSMENT AND PLAN  Pertinent labs & imaging results that were available during my care of the patient were reviewed by me and considered in my medical decision making (see chart for details).   Although patient does not report fevers, she did have a fever today documented to 101.  The small area appears to be draining already without any additional area of fluctuance, and I don't see anything clinically to lance. Patient will be started on clindamycin to cover for  MRSA.  Patient will be admitted to the hospital given Sirs criteria of elevated white blood cell count, fever, with cellulitis.    CONSULTATIONS:   Hospitalist for admission   Patient / Family / Caregiver informed of clinical course, medical decision-making process, and agree with plan.     ___________________________________________   FINAL CLINICAL IMPRESSION(S) / ED DIAGNOSES   Final diagnoses:  Right arm cellulitis              Note: This dictation was prepared with Dragon dictation. Any transcriptional errors that result from this process are unintentional   Governor Rooks, MD 02/17/16 1252

## 2016-02-17 NOTE — ED Notes (Signed)
MD at bedside.  Dr. Shaune PollackLord in room to assess patient and discuss plan of care.  Will continue to monitor.

## 2016-02-17 NOTE — Clinical Social Work Note (Signed)
Clinical Social Work Assessment  Patient Details  Name: Caroline Wilkerson MRN: [id number] Date of Birth: 1923-07-24  Date of referral:  02/17/16               Reason for consult:  Facility Placement, Other (Comment Required) (From [facility] )                Permission sought to share information with:  Chartered certified accountant granted to share information::  Yes, Verbal Permission Granted  Name::      [facility]   Agency::     Relationship::     Contact Information:     Housing/Transportation Living arrangements for the past 2 months:  [place] of Information:  Patient, Adult Children Patient Interpreter Needed:  None Criminal Activity/Legal Involvement Pertinent to Current Situation/Hospitalization:  No - Comment as needed Significant Relationships:  Adult Children Lives with:  Facility Resident Do you feel safe going back to the place where you live?  Yes Need for family participation in patient care:  Yes (Comment)  Care giving concerns:  Patient is a resident at [facility] (fax: [phone])   Social Worker assessment / plan:  Clinical Social Worker ([place]) reviewed chart and noted that patient is from [facility]. CSW contacted [facility] and spoke with [employer]. Per Med Tech patient is private pay and been there for 1 to 2 years. Per Med Tech patient usually walks with a walker at baseline, sometimes needs minimal assistance and is not on oxygen. Per Med Tech patient can return to [facility] when stable.  CSW met with patient and her daughter Caroline Wilkerson [phone] was at bedside. Patient was alert and oriented and was laying in the bed. Patient reported that she is from [facility] and would like to return at D/C. Daughter reported that per MD patient has an infection that is making her weak and once that clears up her strength will return. Daughter prefers patient to return to [facility]. Per daughter patient's son Caroline Wilkerson [phone] is patient's HPOA. FL2 complete.   Employment status:  Retired Nurse, adult PT Recommendations:  Not assessed at this time Information / Referral to community resources:  Other (Comment Required) (ALF VS. SNF )  Patient/Family's Response to care:  Patient and daughter prefer for patient to return to [facility].   Patient/Family's Understanding of and Emotional Response to Diagnosis, Current Treatment, and Prognosis:  Patient was pleasant throughout assessment and thanked CSW for visit.   Emotional Assessment Appearance:  Appears stated age Attitude/Demeanor/Rapport:    Affect (typically observed):  Accepting, Adaptable, Pleasant Orientation:  Oriented to Self, Oriented to Place, Oriented to  Time, Oriented to Situation Alcohol / Substance use:  Not Applicable Psych involvement (Current and /or in the community):  No (Comment)  Discharge Needs  Concerns to be addressed:  Discharge Planning Concerns Readmission within the last 30 days:  No Current discharge risk:  Dependent with Mobility Barriers to Discharge:  Continued Medical Work up   Caroline Freshwater, LCSW 02/17/2016, 4:20 PM

## 2016-02-17 NOTE — Progress Notes (Signed)
Lab called about patient positive MRSA. Patient is now on contact isolation and new standing orders is accessed, will continue to monitor.

## 2016-02-17 NOTE — ED Notes (Signed)
Pt went to dr office and sent here.  Has had cyst under right arm and now has weakness since yesterday.

## 2016-02-17 NOTE — H&P (Signed)
[facility] - [place] at [place]   PATIENT NAME: Caroline Wilkerson    MR#:  [id number]  DATE OF BIRTH:  [birth year]  DATE OF ADMISSION:  02/17/2016  PRIMARY CARE PHYSICIAN: Vonita Moss, MD   REQUESTING/REFERRING PHYSICIAN: Governor Rooks  CHIEF COMPLAINT:   Chief Complaint  Patient presents with  . Weakness  . Abscess    HISTORY OF PRESENT ILLNESS: Caroline Wilkerson  is a 82 y.o. female with a known history of chronic kidney disease, hypertension, hyperlipidemia, Alzheimer's dementia, cardiomegaly, hearing deficit- lives in assisted living. Facility and at her baseline she is able to walk with her walker. Since yesterday she is not able to walk and feeling very weak, when daughter was trying to help her to change her chlorthalidone she noticed in her right basilar there is redness and it was tender to touch. She took her to her primary care doctor's office today and he was concerned with this cellulitis and patient's not able to walk so advised to take her to emergency room for admission.  PAST MEDICAL HISTORY:   Past Medical History  Diagnosis Date  . Chronic kidney disease   . Vertigo   . Hypertension   . Hyperlipidemia   . Arthritis   . Alzheimer's dementia   . Cardiomegaly   . Hearing loss   . Fibrocystic breast     PAST SURGICAL HISTORY: Past Surgical History  Procedure Laterality Date  . Abdominal hysterectomy    . Cholecystectomy    . Breast surgery    . Joint replacement Right     knee and hip  . Rotator cuff surgery Bilateral   . Carpal tunnel release    . Cataract extraction Bilateral     SOCIAL HISTORY:  Social History  Substance Use Topics  . Smoking status: Never Smoker   . Smokeless tobacco: Never Used  . Alcohol Use: No    FAMILY HISTORY:  Family History  Problem Relation Age of Onset  . Hypertension Daughter   . Hypertension Son   . Diabetes Son     DRUG ALLERGIES:  Allergies  Allergen Reactions  . Codeine Diarrhea and  Nausea And Vomiting    REVIEW OF SYSTEMS:   CONSTITUTIONAL: No fever, positive fatigue or weakness.  EYES: No blurred or double vision.  EARS, NOSE, AND THROAT: No tinnitus or ear pain.  RESPIRATORY: No cough, shortness of breath, wheezing or hemoptysis.  CARDIOVASCULAR: No chest pain, orthopnea, edema.  GASTROINTESTINAL: No nausea, vomiting, diarrhea or abdominal pain.  GENITOURINARY: No dysuria, hematuria.  ENDOCRINE: No polyuria, nocturia,  HEMATOLOGY: No anemia, easy bruising or bleeding SKIN: right axilla redness and pain. MUSCULOSKELETAL: No joint pain or arthritis.   NEUROLOGIC: No tingling, numbness, weakness.  PSYCHIATRY: No anxiety or depression.   MEDICATIONS AT HOME:  Prior to Admission medications   Medication Sig Start Date End Date Taking? Authorizing Provider  acetaminophen (TYLENOL) 500 MG tablet Take 500 mg by mouth every 6 (six) hours as needed for moderate pain.    Yes Historical Provider, MD  calcium carbonate (OS-CAL) 600 MG TABS tablet Take 600 mg by mouth 2 (two) times daily with a meal.   Yes Historical Provider, MD  meloxicam (MOBIC) 7.5 MG tablet Take 7.5 mg by mouth 2 (two) times daily.   Yes Historical Provider, MD  mirabegron ER (MYRBETRIQ) 25 MG TB24 tablet Take 1 tablet (25 mg total) by mouth daily. 10/09/15  Yes Steele Sizer, MD  omeprazole (PRILOSEC) 20 MG capsule  Take 20 mg by mouth daily.   Yes Historical Provider, MD      PHYSICAL EXAMINATION:   VITAL SIGNS: Blood pressure 117/86, pulse 82, temperature 98.3 F (36.8 C), temperature source Oral, resp. rate 21, height 5\' 1"  (1.549 m), weight 79.379 kg (175 lb), SpO2 94 %.  GENERAL:  82 y.o.-year-old patient lying in the bed with no acute distress.  EYES: Pupils equal, round, reactive to light and accommodation. No scleral icterus. Extraocular muscles intact.  HEENT: Head atraumatic, normocephalic. Oropharynx and nasopharynx clear. Have hearing deficit. NECK:  Supple, no jugular venous  distention. No thyroid enlargement, no tenderness.  LUNGS: Normal breath sounds bilaterally, no wheezing, rales,rhonchi or crepitation. No use of accessory muscles of respiration.  CARDIOVASCULAR: S1, S2 normal. No murmurs, rubs, or gallops.  ABDOMEN: Soft, nontender, nondistended. Bowel sounds present. No organomegaly or mass.  EXTREMITIES: No pedal edema, cyanosis, or clubbing. Right axilla redness, tender to touch. NEUROLOGIC: Cranial nerves II through XII are intact. Muscle strength 5/5 in all extremities. Sensation intact. Gait not checked.  PSYCHIATRIC: The patient is alert and not well oriented .  SKIN: No obvious rash, lesion, or ulcer.   LABORATORY PANEL:   CBC  Recent Labs Lab 02/11/16 0712 02/17/16 1021  WBC 7.4 18.6*  HGB 12.8 13.9  HCT 39.0 41.2  PLT 198 189  MCV 91.8 90.5  MCH 30.2 30.6  MCHC 32.9 33.8  RDW 13.0 12.9  LYMPHSABS 1.6  --   MONOABS 0.7  --   EOSABS 0.2  --   BASOSABS 0.1  --    ------------------------------------------------------------------------------------------------------------------  Chemistries   Recent Labs Lab 02/11/16 0712 02/17/16 1021  NA 140 137  K 3.2* 3.4*  CL 101 98*  CO2 31 28  GLUCOSE 104* 126*  BUN 21* 26*  CREATININE 0.88 0.94  CALCIUM 9.3 9.4  AST 22  --   ALT 13*  --   ALKPHOS 93  --   BILITOT 1.1  --    ------------------------------------------------------------------------------------------------------------------ estimated creatinine clearance is 37.9 mL/min (by C-G formula based on Cr of 0.94). ------------------------------------------------------------------------------------------------------------------ No results for input(s): TSH, T4TOTAL, T3FREE, THYROIDAB in the last 72 hours.  Invalid input(s): FREET3   Coagulation profile No results for input(s): INR, PROTIME in the last 168  hours. ------------------------------------------------------------------------------------------------------------------- No results for input(s): DDIMER in the last 72 hours. -------------------------------------------------------------------------------------------------------------------  Cardiac Enzymes  Recent Labs Lab 02/11/16 0712  TROPONINI <0.03   ------------------------------------------------------------------------------------------------------------------ Invalid input(s): POCBNP  ---------------------------------------------------------------------------------------------------------------  Urinalysis    Component Value Date/Time   COLORURINE YELLOW* 02/11/2016 0824   APPEARANCEUR HAZY* 02/11/2016 0824   LABSPEC 1.010 02/11/2016 0824   PHURINE 6.0 02/11/2016 0824   GLUCOSEU NEGATIVE 02/11/2016 0824   HGBUR NEGATIVE 02/11/2016 0824   BILIRUBINUR NEGATIVE 02/11/2016 0824   BILIRUBINUR Negative 11/18/2015 1043   KETONESUR NEGATIVE 02/11/2016 0824   PROTEINUR NEGATIVE 02/11/2016 0824   NITRITE NEGATIVE 02/11/2016 0824   NITRITE Negative 11/18/2015 1043   LEUKOCYTESUR NEGATIVE 02/11/2016 0824   LEUKOCYTESUR Negative 11/18/2015 1043     RADIOLOGY: No results found.  EKG: Orders placed or performed during the hospital encounter of 02/11/16  . ED EKG  . ED EKG    IMPRESSION AND PLAN:  * Cellulitis  Blood culture is sent.  We'll give IV Unasyn.  * Generalized weakness  Most likely secondary to cellulitis, treat underlying cause and get physical therapy evaluation.  * Hypertension   As per son she was recently taken off from her blood pressure medicine by PMD  last week, as she had a fall and he suspected that pressure is running lower.   Currently the patient is normal so I will keep her off of any medicine.  * Chronic renal failure   Stable.  All the records are reviewed and case discussed with ED provider. Management plans discussed with the  patient, family and they are in agreement.  CODE STATUS: Code Status History    This patient does not have a recorded code status. Please follow your organizational policy for patients in this situation.     Patient self-report power of attorney is her son who is present in the room at time of admission. Her daughter is also present in the room, I explained the plan to both of them.  TOTAL TIME TAKING CARE OF THIS PATIENT: 50 minutes.    Altamese Dilling M.D on 02/17/2016   Between 7am to 6pm - Pager - [phone]  After 6pm go to [email or web address] - password [email or web address]  [facility]  Office  [phone]  CC: Primary care physician; Vonita Moss, MD   Note: This dictation was prepared with Dragon dictation along with smaller phrase technology. Any transcriptional errors that result from this process are unintentional.

## 2016-02-18 LAB — BASIC METABOLIC PANEL
Anion gap: 9 (ref 5–15)
BUN: 19 mg/dL (ref 6–20)
CALCIUM: 8.3 mg/dL — AB (ref 8.9–10.3)
CO2: 28 mmol/L (ref 22–32)
CREATININE: 0.83 mg/dL (ref 0.44–1.00)
Chloride: 102 mmol/L (ref 101–111)
GFR calc Af Amer: >60 mL/min (ref >60)
GFR calc non Af Amer: >60 mL/min (ref >60)
GLUCOSE: 103 mg/dL — AB (ref 65–99)
Potassium: 2.9 mmol/L — CL (ref 3.5–5.1)
Sodium: 139 mmol/L (ref 135–145)

## 2016-02-18 LAB — MAGNESIUM: MAGNESIUM: 1.6 mg/dL — AB (ref 1.7–2.4)

## 2016-02-18 LAB — CBC
HCT: 34.8 % — ABNORMAL LOW (ref 35.0–47.0)
Hemoglobin: 11.8 g/dL — ABNORMAL LOW (ref 12.0–16.0)
MCH: 31.4 pg (ref 26.0–34.0)
MCHC: 34 g/dL (ref 32.0–36.0)
MCV: 92.5 fL (ref 80.0–100.0)
PLATELETS: 163 10*3/uL (ref 150–440)
RBC: 3.76 MIL/uL — ABNORMAL LOW (ref 3.80–5.20)
RDW: 12.9 % (ref 11.5–14.5)
WBC: 14 10*3/uL — ABNORMAL HIGH (ref 3.6–11.0)

## 2016-02-18 LAB — POTASSIUM: Potassium: 3.7 mmol/L (ref 3.5–5.1)

## 2016-02-18 MED ORDER — MAGNESIUM SULFATE 2 GM/50ML IV SOLN
2.0000 g | Freq: Once | INTRAVENOUS | Status: AC
  Administered 2016-02-18: 2 g via INTRAVENOUS
  Filled 2016-02-18: qty 50

## 2016-02-18 MED ORDER — ENOXAPARIN SODIUM 40 MG/0.4ML ~~LOC~~ SOLN
40.0000 mg | SUBCUTANEOUS | Status: DC
  Administered 2016-02-18 – 2016-02-19 (×2): 40 mg via SUBCUTANEOUS
  Filled 2016-02-18 (×2): qty 0.4

## 2016-02-18 MED ORDER — ACETAMINOPHEN 325 MG PO TABS: 650.0000 mg | ORAL_TABLET | Freq: Four times a day (QID) | ORAL | Status: DC | PRN

## 2016-02-18 MED ORDER — MELOXICAM 7.5 MG PO TABS
7.5000 mg | ORAL_TABLET | Freq: Two times a day (BID) | ORAL | Status: DC | PRN
  Administered 2016-02-18: 7.5 mg via ORAL
  Filled 2016-02-18: qty 1

## 2016-02-18 MED ORDER — POTASSIUM CHLORIDE 10 MEQ/100ML IV SOLN
10.0000 meq | INTRAVENOUS | Status: AC
  Administered 2016-02-18 (×4): 10 meq via INTRAVENOUS
  Filled 2016-02-18 (×4): qty 100

## 2016-02-18 MED ORDER — LORAZEPAM 2 MG/ML IJ SOLN
1.0000 mg | Freq: Once | INTRAMUSCULAR | Status: AC
  Administered 2016-02-18: 1 mg via INTRAVENOUS
  Filled 2016-02-18: qty 1

## 2016-02-18 NOTE — Progress Notes (Addendum)
MEDICATION RELATED CONSULT NOTE - INITIAL   Pharmacy Consult for electrolyte monitoring and replacement Indication: hypokalemia  Allergies  Allergen Reactions  . Codeine Diarrhea and Nausea And Vomiting   Patient Measurements: Height: 5\' 1"  (154.9 cm) Weight: 175 lb (79.379 kg) IBW/kg (Calculated) : 47.8  Vital Signs: Temp: 98.2 F (36.8 C) (03/08 0837) Temp Source: Axillary (03/08 0837) BP: 138/66 mmHg (03/08 0837) Pulse Rate: 64 (03/08 0837) Intake/Output from previous day: 03/07 0701 - 03/08 0700 In: 50 [IV Piggyback:50] Out: -  Intake/Output from this shift:    Labs:  Recent Labs  02/17/16 1021 02/18/16 0552  WBC 18.6* 14.0*  HGB 13.9 11.8*  HCT 41.2 34.8*  PLT 189 163  CREATININE 0.94 0.83   Estimated Creatinine Clearance: 43 mL/min (by C-G formula based on Cr of 0.83).  Medical History: Past Medical History  Diagnosis Date  . Chronic kidney disease   . Vertigo   . Hypertension   . Hyperlipidemia   . Arthritis   . Alzheimer's dementia   . Cardiomegaly   . Hearing loss   . Fibrocystic breast    Assessment: Pharmacy consulted to monitor and replace electrolytes in this 82 year old female for hypokalemia. Patient is not currently on any diuretics.   K = 2.9  Goal of Therapy:  Electrolytes within normal limits  Plan:  Ordered add-on magnesium Ordered KCl 10 mEq IV x 4 runs Will recheck K at 1800 tonight  Thank you for allowing pharmacy to be part of this patient's care.  Cindi CarbonMary M Kaimana Lurz, PharmD Clinical Pharmacist 02/18/2016,9:15 AM   Magnesium was low @ 1.6 - ordered 2 g IV Mg x 1 dose. Will recheck Mg with AM labs tomorrow.  Cindi CarbonMary M Jaquitta Dupriest, PharmD Clinical Pharmacist 02/18/2016 2:28 PM

## 2016-02-18 NOTE — Progress Notes (Addendum)
Patient ID: LABRESHA MELLOR, female   DOB: December 29, 1922, 82 y.o.   MRN: [id number] [facility] [place] [facility]   PATIENT NAME: Caralynn Gelber    MR#:  [id number]  DATE OF BIRTH:  Mar 31, 1923  SUBJECTIVE:  Came in with weakness found to have significant pain in her right axilla and a draining from the boil   REVIEW OF SYSTEMS:   Review of Systems  Constitutional: Negative for fever, chills and weight loss.  HENT: Negative for ear discharge, ear pain and nosebleeds.   Eyes: Negative for blurred vision, pain and discharge.  Respiratory: Negative for sputum production, shortness of breath, wheezing and stridor.   Cardiovascular: Negative for chest pain, palpitations, orthopnea and PND.  Gastrointestinal: Negative for nausea, vomiting, abdominal pain and diarrhea.  Genitourinary: Negative for urgency and frequency.  Musculoskeletal: Positive for back pain and joint pain.  Skin: Positive for rash.       Right axilla  Neurological: Positive for weakness. Negative for sensory change, speech change and focal weakness.  Psychiatric/Behavioral: Negative for depression and hallucinations. The patient is not nervous/anxious.     Tolerating PT: Pending Tolerating diet: Yes  DRUG ALLERGIES:   Allergies  Allergen Reactions  . Codeine Diarrhea and Nausea And Vomiting    VITALS:  Blood pressure 138/66, pulse 64, temperature 98.2 F (36.8 C), temperature source Axillary, resp. rate 20, height  (1.549 m), weight 79.379 kg (175 lb), SpO2 94 %.  PHYSICAL EXAMINATION:  GENERAL:  82 y.o.-year-old patient lying in the bed with no acute distress.  EYES: Pupils equal, round, reactive to light and accommodation. No scleral icterus. Extraocular muscles intact.  HEENT: Head atraumatic, normocephalic. Oropharynx and nasopharynx clear.  NECK:  Supple, no jugular venous distention. No thyroid enlargement, no tenderness.  LUNGS: Normal breath sounds bilaterally, no  wheezing, rales,rhonchi or crepitation. No use of accessory muscles of respiration.  CARDIOVASCULAR: S1, S2 normal. No murmurs, rubs, or gallops.  ABDOMEN: Soft, nontender, nondistended. Bowel sounds present. No organomegaly or mass.  EXTREMITIES: No pedal edema, cyanosis, or clubbing.  NEUROLOGIC: Cranial nerves II through XII are intact. Muscle strength 5/5 in all extremities. Sensation intact. Gait not checked.  PSYCHIATRIC: The patient is alert and oriented x 3.  SKIN: No obvious rash, lesion, or ulcer. Right axillary furunculosis   LABORATORY PANEL:   CBC  Recent Labs Lab 02/17/16 1021 02/18/16 0552  WBC 18.6* 14.0*  HGB 13.9 11.8*  HCT 41.2 34.8*  PLT 189 163   ------------------------------------------------------------------------------------------------------------------  Chemistries   Recent Labs Lab 02/17/16 1021 02/18/16 0552  NA 137 139  K 3.4* 2.9*  CL 98* 102  CO2 28 28  GLUCOSE 126* 103*  BUN 26* 19  CREATININE 0.94 0.83  CALCIUM 9.4 8.3*  MG  --  1.6*    ASSESSMENT AND PLAN:  Iriana Artley is a 82 y.o. female with a known history of chronic kidney disease, hypertension, hyperlipidemia, Alzheimer's dementia, cardiomegaly, hearing deficit- lives in assisted living. Facility and at her baseline she is able to walk with her walker. Since yesterday she is not able to walk and feeling very weak  * right axillary Hidradenitis suppurativa with Cellulitis Send wound culture Continue IV Unasyn, follow blood cultures -White count trending down to 14,000 (18K) -We'll continue to monitor if seems patient developing abscess consider surgical consultation  * Generalized weakness Most likely secondary to cellulitis, treat underlying cause and get physical therapy evaluation.  * Hypertension  As per son she  was recently taken off from her blood pressure medicine by PMD last week, as she had a fall and he suspected that pressure is running lower.  Currently  the patient is normal so  will keep her off of any medicine.  * Chronic renal failure  Stable.   *Spoke with patient's son and daughter-in-law at length. Patient and family wishes to return back to the [facility] with home health PT if possible.   All the records are reviewed and case discussed with Care Management/Social Workerr. Management plans discussed with the patient, family and they are in agreement.  CODE STATUS: Full  TOTAL TIME TAKING CARE OF THIS PATIENT: 30 minutes.   POSSIBLE D/C IN one to 2 DAYS, DEPENDING ON CLINICAL CONDITION.   Emilyrose Darrah M.D on 02/18/2016 at 1:52 PM  Between 7am to 6pm - Pager - [phone]  After 6pm go to [email or web address] - password [email or web address] [facility]  [facility] [place] [facility]  Office  [phone]  CC: Primary care physician; Vonita MossMark Crissman, MD

## 2016-02-18 NOTE — Progress Notes (Signed)
Lab called with a critical Potassium of 2.9. Prime Dr called, will continue to monitor.

## 2016-02-18 NOTE — Progress Notes (Signed)
MEDICATION RELATED CONSULT NOTE - Follow Up  Pharmacy Consult for electrolyte monitoring and replacement Indication: hypokalemia  Allergies  Allergen Reactions  . Codeine Diarrhea and Nausea And Vomiting   Patient Measurements: Height: 5\' 1"  (154.9 cm) Weight: 175 lb (79.379 kg) IBW/kg (Calculated) : 47.8  Vital Signs: Temp: 98.3 F (36.8 C) (03/08 1555) Temp Source: Oral (03/08 1555) BP: 128/76 mmHg (03/08 1555) Pulse Rate: 10 (03/08 1555) Intake/Output from previous day: 03/07 0701 - 03/08 0700 In: 50 [IV Piggyback:50] Out: -  Intake/Output from this shift: Total I/O In: 1154 [P.O.:660; IV Piggyback:494] Out: 326 [Urine:326]  Labs:  Recent Labs  02/17/16 1021 02/18/16 0552  WBC 18.6* 14.0*  HGB 13.9 11.8*  HCT 41.2 34.8*  PLT 189 163  CREATININE 0.94 0.83  MG  --  1.6*   Estimated Creatinine Clearance: 43 mL/min (by C-G formula based on Cr of 0.83).  Medical History: Past Medical History  Diagnosis Date  . Chronic kidney disease   . Vertigo   . Hypertension   . Hyperlipidemia   . Arthritis   . Alzheimer's dementia   . Cardiomegaly   . Hearing loss   . Fibrocystic breast    Assessment: Pharmacy consulted to monitor and replace electrolytes in this 82 year old female for hypokalemia. Patient is not currently on any diuretics.   K = 2.9 Follow up K at 1719 of 3.7  Goal of Therapy:  Electrolytes within normal limits  Plan:  Potassium within goal range. No supplementation warranted at this time. BMP, magnesium, and phosphorus ordered in AM.    Thank you for allowing pharmacy to be part of this patient's care.  Clarisa Schoolsrystal Mylena Sedberry, PharmD Clinical Pharmacist 02/18/2016

## 2016-02-18 NOTE — Progress Notes (Signed)
PT Cancellation Note  Patient Details Name: Caroline Wilkerson MRN: [id number] DOB: 02-25-1923   Cancelled Treatment:    Reason Eval/Treat Not Completed: Medical issues which prohibited therapy. Pt's current potassium level is 2.9 which is contraindicated to participate in therapy. PT will f/u and complete evaluation when pt is appropriate.   Adelene IdlerMindy Jo Woodson Macha, PT, DPT  02/18/2016, 10:47 AM [phone]

## 2016-02-19 LAB — PHOSPHORUS: Phosphorus: 2.9 mg/dL (ref 2.5–4.6)

## 2016-02-19 LAB — BASIC METABOLIC PANEL
ANION GAP: 10 (ref 5–15)
BUN: 15 mg/dL (ref 6–20)
CO2: 27 mmol/L (ref 22–32)
Calcium: 8.6 mg/dL — ABNORMAL LOW (ref 8.9–10.3)
Chloride: 98 mmol/L — ABNORMAL LOW (ref 101–111)
Creatinine, Ser: 0.65 mg/dL (ref 0.44–1.00)
GFR calc Af Amer: >60 mL/min (ref >60)
GFR calc non Af Amer: >60 mL/min (ref >60)
GLUCOSE: 101 mg/dL — AB (ref 65–99)
POTASSIUM: 3 mmol/L — AB (ref 3.5–5.1)
Sodium: 135 mmol/L (ref 135–145)

## 2016-02-19 LAB — MAGNESIUM: MAGNESIUM: 1.7 mg/dL (ref 1.7–2.4)

## 2016-02-19 MED ORDER — MAGNESIUM SULFATE 2 GM/50ML IV SOLN
2.0000 g | Freq: Once | INTRAVENOUS | Status: AC
Start: 2016-02-19 — End: 2016-02-19
  Administered 2016-02-19: 2 g via INTRAVENOUS
  Filled 2016-02-19: qty 50

## 2016-02-19 MED ORDER — DOCUSATE SODIUM 100 MG PO CAPS
100.0000 mg | ORAL_CAPSULE | Freq: Two times a day (BID) | ORAL | Status: DC
  Administered 2016-02-19 – 2016-02-20 (×3): 100 mg via ORAL
  Filled 2016-02-19 (×3): qty 1

## 2016-02-19 MED ORDER — CLINDAMYCIN HCL 150 MG PO CAPS
300.0000 mg | ORAL_CAPSULE | Freq: Four times a day (QID) | ORAL | Status: DC
  Administered 2016-02-19 – 2016-02-20 (×5): 300 mg via ORAL
  Filled 2016-02-19: qty 1
  Filled 2016-02-19: qty 2
  Filled 2016-02-19: qty 1
  Filled 2016-02-19: qty 2
  Filled 2016-02-19: qty 1
  Filled 2016-02-19: qty 2
  Filled 2016-02-19: qty 1

## 2016-02-19 MED ORDER — POTASSIUM CHLORIDE 20 MEQ PO PACK
20.0000 meq | PACK | ORAL | Status: AC
  Administered 2016-02-19 (×2): 20 meq via ORAL
  Filled 2016-02-19: qty 1

## 2016-02-19 NOTE — Progress Notes (Signed)
Per PT patient can return to [facility] with a wheelchair and assistance when patient gets up. Clinical Child psychotherapistocial Worker (CSW) contacted [facility] and spoke with [employer]. Per Marylene LandAngela they can assist patient every time she needs to get up. Per Marylene LandAngela their are several aides on the hall way that round on patients. CSW contacted patient's son [name] and made him aware of above. [name] is in agreement with patient returning to [facility]. CSW made [facility] aware that MD will likely D/C patient tomorrow. CSW will continue to follow and assist as needed.   Jetta LoutBailey Morgan, LCSW [phone]

## 2016-02-19 NOTE — Care Management (Signed)
Home health referral sent to [facility] to see if they can take [employer]. RNCM will continue to follow.

## 2016-02-19 NOTE — Progress Notes (Signed)
Patient ID: REMEDY CORPORAN, female   DOB: 02/17/1923, 82 y.o.   MRN: [id number] [facility] - [place] at [facility]   PATIENT NAME: Caroline Wilkerson    MR#:  [id number]  DATE OF BIRTH:  05/24/1923  SUBJECTIVE:  Came in with weakness found to have significant pain in her right axilla and a draining from the boil  Physical better today. Son in the room. REVIEW OF SYSTEMS:   Review of Systems  Constitutional: Negative for fever, chills and weight loss.  HENT: Negative for ear discharge, ear pain and nosebleeds.   Eyes: Negative for blurred vision, pain and discharge.  Respiratory: Negative for sputum production, shortness of breath, wheezing and stridor.   Cardiovascular: Negative for chest pain, palpitations, orthopnea and PND.  Gastrointestinal: Negative for nausea, vomiting, abdominal pain and diarrhea.  Genitourinary: Negative for urgency and frequency.  Musculoskeletal: Positive for back pain and joint pain.  Skin: Positive for rash.       Right axilla  Neurological: Positive for weakness. Negative for sensory change, speech change and focal weakness.  Psychiatric/Behavioral: Negative for depression and hallucinations. The patient is not nervous/anxious.     Tolerating PT: Pending Tolerating diet: Yes  DRUG ALLERGIES:   Allergies  Allergen Reactions  . Codeine Diarrhea and Nausea And Vomiting    VITALS:  Blood pressure 118/58, pulse 60, temperature 94.8 F (34.9 C), temperature source Oral, resp. rate 20, height  (1.549 m), weight 79.379 kg (175 lb), SpO2 98 %.  PHYSICAL EXAMINATION:  GENERAL:  82 y.o.-year-old patient lying in the bed with no acute distress.  EYES: Pupils equal, round, reactive to light and accommodation. No scleral icterus. Extraocular muscles intact.  HEENT: Head atraumatic, normocephalic. Oropharynx and nasopharynx clear.  NECK:  Supple, no jugular venous distention. No thyroid enlargement, no tenderness.  LUNGS: Normal  breath sounds bilaterally, no wheezing, rales,rhonchi or crepitation. No use of accessory muscles of respiration.  CARDIOVASCULAR: S1, S2 normal. No murmurs, rubs, or gallops.  ABDOMEN: Soft, nontender, nondistended. Bowel sounds present. No organomegaly or mass.  EXTREMITIES: No pedal edema, cyanosis, or clubbing.  NEUROLOGIC: Cranial nerves II through XII are intact. Muscle strength 5/5 in all extremities. Sensation intact. Gait not checked.  PSYCHIATRIC:patient is alert and oriented x 3.  SKIN:  Right axillary furunculosis significant amount of drainage improved swelling  LABORATORY PANEL:   CBC  Recent Labs Lab 02/17/16 1021 02/18/16 0552  WBC 18.6* 14.0*  HGB 13.9 11.8*  HCT 41.2 34.8*  PLT 189 163   ------------------------------------------------------------------------------------------------------------------  Chemistries   Recent Labs Lab 02/18/16 0552 02/18/16 1719 02/19/16 0606  NA 139  --  135  K 2.9* 3.7 3.0*  CL 102  --  98*  CO2 28  --  27  GLUCOSE 103*  --  101*  BUN 19  --  15  CREATININE 0.83  --  0.65  CALCIUM 8.3*  --  8.6*  MG 1.6*  --  1.7    ASSESSMENT AND PLAN:  Caroline Wilkerson is a 82 y.o. female with a known history of chronic kidney disease, hypertension, hyperlipidemia, Alzheimer's dementia, cardiomegaly, hearing deficit- lives in assisted living. Facility and at her baseline she is able to walk with her walker. Since yesterday she is not able to walk and feeling very weak  * right axillary Hidradenitis suppurativa with Cellulitis  wound culture staph aureus blood cultures neg -White count trending down to 14,000 (18K) -Patient's abscess is improved. Patient feels a lot better.  Hold a surgical consultation. -Change antibiotics to by mouth clindamycin to cover MRSA  * Generalized weakness Most likely secondary to cellulitis, treat underlying cause and get physical therapy evaluation.  * Hypertension  As per son she was recently taken  off from her blood pressure medicine by PMD last week, as she had a fall and he suspected that pressure is running lower.  Currently the patient is normal so  will keep her off of any medicine.  * Chronic renal failure  Stable.   *Spoke with patient's son and daughter-in-law at length. Patient and family wishes to return back to the [facility] with [facility] health PT if possible.   All the records are reviewed and case discussed with Care Management/Social Workerr. Management plans discussed with the patient, family and they are in agreement.  CODE STATUS: Full  TOTAL TIME TAKING CARE OF THIS PATIENT: 30 minutes.   POSSIBLE D/C IN one to 2 DAYS, DEPENDING ON CLINICAL CONDITION.   Bowden Boody M.D on 02/19/2016 at 1:25 PM  Between 7am to 6pm - Pager - [phone]  After 6pm go to [email or web address] - password [email or web address] [email or web address]  [facility] [place] [facility]  Office  [phone]  CC: Primary care physician; Vonita MossMark Crissman, MD

## 2016-02-19 NOTE — Progress Notes (Signed)
Pt extremely agitated and confused. Yelling out and attempting to get out of bed. Called Dr. Nemiah CommanderKalisetti made her aware. New orders were given.

## 2016-02-19 NOTE — Clinical Documentation Improvement (Signed)
Hospitalist  Can the diagnosis of CKD be further specified?   CKD Stage I - GFR greater than or equal to 90  CKD Stage II - GFR 60-89  CKD Stage III - GFR 30-59  CKD Stage IV - GFR 15-29  CKD Stage V - GFR < 15  ESRD (End Stage Renal Disease)  Other condition  Unable to clinically determine   Supporting Information: : (risk factors, signs and symptoms, diagnostics, treatment) Patient has a history of chronic kidney disease per 3/07 progress notes. Labs:    Bun     Creat     GFR: 3/09:     15        0.65      >60 3/07:     26        0.94        52   Please exercise your independent, professional judgment when responding. A specific answer is not anticipated or expected.   Thank Caroline DonovanYou, Hanad Leino [facility] Health Information Management [place] [phone]

## 2016-02-19 NOTE — Evaluation (Signed)
Physical Therapy Evaluation Patient Details Name: Caroline Wilkerson MRN: [id number] DOB: Apr 29, 1923 Today's Date: 02/19/2016   History of Present Illness  presented to ER secondary to generalized weakness and redness over R axilla; admitted for management of R axillary cellulitis.  Clinical Impression  Upon evaluation, patient alert and oriented to self only; follows simple commands, but demonstrates limited insight into deficits.  Bilat UE/LE strength and ROM grossly WFL and symmetrical; no focal weakness or pain noted.  Able to complete bed mobility with min/mod assist; sit/stand, basic transfers and very short-distance gait (4-5 steps) with RW, min assist.  Mild L lateral lean; very short, shuffling steps (limited ability to correct despite cuing from therapist).  Do recommend use of RW and +1 assist for all mobility at this time; may consider use of manual WC for longer-distances (beyond basic transfer) until strength/endurance return to baseline. Would benefit from skilled PT to address above deficits and promote optimal return to PLOF; Recommend transition to HHPT with 24 hour sup/assist upon discharge from acute hospitalization.  Feel patient likely to benefit most from return to familiar living environment given baseline dementia with therapy services coming to her home setting.     Follow Up Recommendations Home health PT;Supervision/Assistance - 24 hour (hands-on assist (+1) with all mobility)    Equipment Recommendations  Rolling walker with 5" wheels;Wheelchair cushion (measurements PT);Wheelchair (measurements PT)    Recommendations for Other Services       Precautions / Restrictions Precautions Precautions: Fall Precaution Comments: contact isolation Restrictions Weight Bearing Restrictions: No      Mobility  Bed Mobility Overal bed mobility: Needs Assistance Bed Mobility: Supine to Sit     Supine to sit: Min assist;Mod assist     General bed mobility comments: cuing  for hand placement, task sequencing  Transfers Overall transfer level: Needs assistance Equipment used: Rolling walker (2 wheeled) Transfers: Sit to/from Stand Sit to Stand: Min assist         General transfer comment: mild L lateral lean at times  Ambulation/Gait Ambulation/Gait assistance: Min assist Ambulation Distance (Feet): 5 Feet Assistive device: Rolling walker (2 wheeled)     Gait velocity interpretation: <1.8 ft/sec, indicative of risk for recurrent falls General Gait Details: 4-5 steps from bed/recliner with RW, min assist; L lateral lean at times.  Very short, shuffling steps--limited ability to correct despite cuing from therapist.  Stairs            Wheelchair Mobility    Modified Rankin (Stroke Patients Only)       Balance Overall balance assessment: Needs assistance Sitting-balance support: No upper extremity supported;Feet supported Sitting balance-Leahy Scale: Fair Sitting balance - Comments: L lateral lean with fatigue, divided attention; corrects with verbal cuing from therapist; seated functional reach approx 5-6"   Standing balance support: Bilateral upper extremity supported Standing balance-Leahy Scale: Fair                               Pertinent Vitals/Pain Pain Assessment: No/denies pain    Home Living Family/patient expects to be discharged to:: Assisted living               Home Equipment: Dan Humphreys - 4 wheels Additional Comments: resident of [facility]    Prior Function Level of Independence: Needs assistance         Comments: Assist from staff for ADLs as needed; able to ambulate for distances within facility (to/from dining  hall) with [id number].     Hand Dominance        Extremity/Trunk Assessment   Upper Extremity Assessment: Overall WFL for tasks assessed           Lower Extremity Assessment: Overall WFL for tasks assessed (grossly 4/5 throughout)         Communication   Communication: No  difficulties  Cognition Arousal/Alertness: Awake/alert Behavior During Therapy: WFL for tasks assessed/performed Overall Cognitive Status: History of cognitive impairments - at baseline (oriented to self only; pleasant and cooperative; follows all simple, single-step commands)                      General Comments      Exercises Other Exercises Other Exercises: Supine LE therex, 1x10, AROM for muscular strength/endurance with functional activities.  Good isolated strength/control of bilat LEs noted.      Assessment/Plan    PT Assessment Patient needs continued PT services  PT Diagnosis Difficulty walking;Generalized weakness   PT Problem List Decreased strength;Decreased activity tolerance;Decreased balance;Decreased mobility;Decreased cognition;Decreased knowledge of use of DME;Decreased safety awareness;Decreased knowledge of precautions  PT Treatment Interventions DME instruction;Gait training;Functional mobility training;Therapeutic activities;Therapeutic exercise;Balance training;Patient/family education;Cognitive remediation   PT Goals (Current goals can be found in the Care Plan section) Acute Rehab PT Goals Patient Stated Goal: per family, hope for patient to return to ALF PT Goal Formulation: Patient unable to participate in goal setting Time For Goal Achievement: 03/04/16 Potential to Achieve Goals: Fair    Frequency Min 2X/week   Barriers to discharge Decreased caregiver support      Co-evaluation               End of Session Equipment Utilized During Treatment: Gait belt Activity Tolerance: Patient tolerated treatment well Patient left: in bed;with call bell/phone within reach;with chair alarm set;with family/visitor present Nurse Communication: Mobility status         Time: 1104-1130 PT Time Calculation (min) (ACUTE ONLY): 26 min   Charges:   PT Evaluation $PT Eval Low Complexity: 1 Procedure PT Treatments $Therapeutic Exercise: 8-22  mins   PT G Codes:        Darsh Vandevoort H. Manson PasseyBrown, PT, DPT, NCS 02/19/2016, 2:26 PM [phone]

## 2016-02-19 NOTE — Clinical Documentation Improvement (Signed)
Hospitalist  Abnormal Lab/Test Results:   Potassium: 3/08:  2.9. 3/09:  3.0   Possible Clinical Conditions associated with below indicators  Hypokalemia  Other Condition  Cannot Clinically Determine   Treatment Provided: 3/08: Potassium chloride 10 meq in 100 ml solution IV infusion x 4 doses.   Please exercise your independent, professional judgment when responding. A specific answer is not anticipated or expected.   Thank Sabino DonovanYou,  Quentez Lober [facility] [place] [phone]

## 2016-02-19 NOTE — Progress Notes (Signed)
MEDICATION RELATED CONSULT NOTE - INITIAL   Pharmacy Consult for electrolyte monitoring and replacement Indication: hypokalemia  Allergies  Allergen Reactions  . Codeine Diarrhea and Nausea And Vomiting   Patient Measurements: Height: 5\' 1"  (154.9 cm) Weight: 175 lb (79.379 kg) IBW/kg (Calculated) : 47.8  Vital Signs: Temp: 94.8 F (34.9 C) (03/09 0807) Temp Source: Oral (03/09 0807) BP: 118/58 mmHg (03/09 0807) Pulse Rate: 60 (03/09 0807) Intake/Output from previous day: 03/08 0701 - 03/09 0700 In: 1154 [P.O.:660; IV Piggyback:494] Out: 576 [Urine:576] Intake/Output from this shift:    Labs:  Recent Labs  02/17/16 1021 02/18/16 0552 02/19/16 0606  WBC 18.6* 14.0*  --   HGB 13.9 11.8*  --   HCT 41.2 34.8*  --   PLT 189 163  --   CREATININE 0.94 0.83 0.65  MG  --  1.6* 1.7  PHOS  --   --  2.9   Estimated Creatinine Clearance: 44.6 mL/min (by C-G formula based on Cr of 0.65).  Medical History: Past Medical History  Diagnosis Date  . Chronic kidney disease   . Vertigo   . Hypertension   . Hyperlipidemia   . Arthritis   . Alzheimer's dementia   . Cardiomegaly   . Hearing loss   . Fibrocystic breast    Assessment: Pharmacy consulted to monitor and replace electrolytes in this 82 year old female for hypokalemia. Patient is not currently on any diuretics.   K = 3.0 (low) Mg = 1.7 (low)  Goal of Therapy:  Electrolytes within normal limits  Plan:  Ordered magnesium 2 g IV x 1 dose KCl 20 mEq PO every 2 hours x 2 doses for a total of 40 mEq PO today  Will recheck K/Mg with AM labs tomorrow.  Thank you for allowing pharmacy to be part of this patient's care.  Cindi CarbonMary M Najai Waszak, PharmD Clinical Pharmacist 02/19/2016,9:47 AM

## 2016-02-19 NOTE — Care Management (Signed)
   Patient suffers from lower extremity cellulitis, difficulty walking due to weakness which impairs their ability to perform daily activities like toileting, feeding, dressing, grooming, bathing in the home. A cane, walker, crutch will not resolve  issue with performing activities of daily living. A wheelchair will allow patient to safely perform daily activities.  Patient can safely propel the wheelchair in the home or has a caregiver who can provide assistance.   [facility]. Rx requested from Dr. Enedina FinnerSona Patel.

## 2016-02-19 NOTE — Care Management (Signed)
Received notification from Abby with [facility] that they will be able to accept this patient. Home health PT and face-to-face needed.

## 2016-02-19 NOTE — Care Management Important Message (Signed)
Important Message  Patient Details  Name: Caroline Wilkerson MRN: [id number] Date of Birth: 1923/11/09   Medicare Important Message Given:  Yes    Collie SiadAngela Nicklaus Alviar, RN 02/19/2016, 9:34 AM

## 2016-02-20 ENCOUNTER — Emergency Department
Admission: EM | Admit: 2016-02-20 | Discharge: 2016-02-20 | Disposition: A | Discharge status: Home / Self Care | Payer: Medicare HMO | Attending: Student | Admitting: Student

## 2016-02-20 ENCOUNTER — Emergency Department: Payer: Medicare HMO

## 2016-02-20 ENCOUNTER — Encounter: Payer: Self-pay | Admitting: Emergency Medicine

## 2016-02-20 DIAGNOSIS — Z79899 Other long term (current) drug therapy: Secondary | ICD-10-CM | POA: Insufficient documentation

## 2016-02-20 DIAGNOSIS — Y998 Other external cause status: Secondary | ICD-10-CM | POA: Insufficient documentation

## 2016-02-20 DIAGNOSIS — N189 Chronic kidney disease, unspecified: Secondary | ICD-10-CM | POA: Diagnosis not present

## 2016-02-20 DIAGNOSIS — Y92129 Unspecified place in nursing home as the place of occurrence of the external cause: Secondary | ICD-10-CM | POA: Insufficient documentation

## 2016-02-20 DIAGNOSIS — T1490XA Injury, unspecified, initial encounter: Secondary | ICD-10-CM

## 2016-02-20 DIAGNOSIS — W01198A Fall on same level from slipping, tripping and stumbling with subsequent striking against other object, initial encounter: Secondary | ICD-10-CM | POA: Insufficient documentation

## 2016-02-20 DIAGNOSIS — S0003XA Contusion of scalp, initial encounter: Secondary | ICD-10-CM | POA: Diagnosis not present

## 2016-02-20 DIAGNOSIS — R69 Illness, unspecified: Secondary | ICD-10-CM | POA: Diagnosis not present

## 2016-02-20 DIAGNOSIS — R51 Headache: Secondary | ICD-10-CM | POA: Diagnosis not present

## 2016-02-20 DIAGNOSIS — I129 Hypertensive chronic kidney disease with stage 1 through stage 4 chronic kidney disease, or unspecified chronic kidney disease: Secondary | ICD-10-CM | POA: Insufficient documentation

## 2016-02-20 DIAGNOSIS — F0280 Dementia in other diseases classified elsewhere without behavioral disturbance: Secondary | ICD-10-CM | POA: Insufficient documentation

## 2016-02-20 DIAGNOSIS — S199XXA Unspecified injury of neck, initial encounter: Secondary | ICD-10-CM | POA: Diagnosis not present

## 2016-02-20 DIAGNOSIS — Z792 Long term (current) use of antibiotics: Secondary | ICD-10-CM | POA: Diagnosis not present

## 2016-02-20 DIAGNOSIS — S0990XA Unspecified injury of head, initial encounter: Secondary | ICD-10-CM

## 2016-02-20 DIAGNOSIS — W19XXXA Unspecified fall, initial encounter: Secondary | ICD-10-CM

## 2016-02-20 DIAGNOSIS — Z791 Long term (current) use of non-steroidal anti-inflammatories (NSAID): Secondary | ICD-10-CM | POA: Insufficient documentation

## 2016-02-20 DIAGNOSIS — Y9389 Activity, other specified: Secondary | ICD-10-CM | POA: Insufficient documentation

## 2016-02-20 DIAGNOSIS — G309 Alzheimer's disease, unspecified: Secondary | ICD-10-CM | POA: Diagnosis not present

## 2016-02-20 DIAGNOSIS — S0001XA Abrasion of scalp, initial encounter: Secondary | ICD-10-CM | POA: Diagnosis not present

## 2016-02-20 LAB — CBC WITH DIFFERENTIAL/PLATELET
Basophils Absolute: 0.1 K/uL (ref 0–0.1)
Basophils Relative: 1 %
Eosinophils Absolute: 0.3 K/uL (ref 0–0.7)
Eosinophils Relative: 3 %
HCT: 37.8 % (ref 35.0–47.0)
Hemoglobin: 12.7 g/dL (ref 12.0–16.0)
Lymphocytes Relative: 13 %
Lymphs Abs: 1.4 K/uL (ref 1.0–3.6)
MCH: 30.9 pg (ref 26.0–34.0)
MCHC: 33.8 g/dL (ref 32.0–36.0)
MCV: 91.5 fL (ref 80.0–100.0)
Monocytes Absolute: 1.1 K/uL — ABNORMAL HIGH (ref 0.2–0.9)
Monocytes Relative: 11 %
Neutro Abs: 7.9 K/uL — ABNORMAL HIGH (ref 1.4–6.5)
Neutrophils Relative %: 72 %
Platelets: 233 K/uL (ref 150–440)
RBC: 4.12 MIL/uL (ref 3.80–5.20)
RDW: 13 % (ref 11.5–14.5)
WBC: 10.8 K/uL (ref 3.6–11.0)

## 2016-02-20 LAB — WOUND CULTURE: SPECIAL REQUESTS: NORMAL

## 2016-02-20 LAB — BASIC METABOLIC PANEL
Anion gap: 6 (ref 5–15)
BUN: 23 mg/dL — AB (ref 6–20)
CHLORIDE: 100 mmol/L — AB (ref 101–111)
CO2: 30 mmol/L (ref 22–32)
CREATININE: 0.73 mg/dL (ref 0.44–1.00)
Calcium: 8.5 mg/dL — ABNORMAL LOW (ref 8.9–10.3)
Glucose, Bld: 100 mg/dL — ABNORMAL HIGH (ref 65–99)
Potassium: 3.9 mmol/L (ref 3.5–5.1)
SODIUM: 136 mmol/L (ref 135–145)

## 2016-02-20 LAB — BASIC METABOLIC PANEL WITH GFR
Anion gap: 10 (ref 5–15)
BUN: 24 mg/dL — ABNORMAL HIGH (ref 6–20)
CO2: 27 mmol/L (ref 22–32)
Calcium: 8.8 mg/dL — ABNORMAL LOW (ref 8.9–10.3)
Chloride: 98 mmol/L — ABNORMAL LOW (ref 101–111)
Creatinine, Ser: 0.84 mg/dL (ref 0.44–1.00)
GFR calc Af Amer: >60 mL/min (ref >60)
GFR calc non Af Amer: 59 mL/min — ABNORMAL LOW (ref >60)
Glucose, Bld: 115 mg/dL — ABNORMAL HIGH (ref 65–99)
Potassium: 4 mmol/L (ref 3.5–5.1)
Sodium: 135 mmol/L (ref 135–145)

## 2016-02-20 LAB — MAGNESIUM: MAGNESIUM: 1.9 mg/dL (ref 1.7–2.4)

## 2016-02-20 MED ORDER — CLINDAMYCIN HCL 300 MG PO CAPS: 300.0000 mg | ORAL_CAPSULE | Freq: Four times a day (QID) | ORAL | Status: DC

## 2016-02-20 NOTE — NC FL2 (Signed)
  [place] MEDICAID FL2 LEVEL OF CARE SCREENING TOOL     IDENTIFICATION  Patient Name: Caroline Wilkerson Birthdate: [birth year] Sex: female Admission Date (Current Location): 02/17/2016  [facility] [place]   Facility and Address:  [facility], [address], [place], [place] [address]      Provider Number: [id number]  Attending Physician Name and Address:  Enedina FinnerSona Patel, MD  Relative Name and Phone Number:       Current Level of Care: Hospital Recommended Level of Care: Assisted Living Facility Prior Approval Number:    Date Approved/Denied:   PASRR Number:  [id number])  Discharge Plan: Domiciliary (Rest home)    Current Diagnoses: Patient Active Problem List   Diagnosis Date Noted  . Cellulitis 02/17/2016  . Generalized weakness 02/17/2016  . Decubitus ulcer of buttock, stage 1 10/21/2015  . Hypertension   . Hyperlipidemia   . Alzheimer's dementia     Orientation RESPIRATION BLADDER Height & Weight     Self, Place  Normal Incontinent Weight: 175 lb (79.379 kg) Height:  5\' 1"  (154.9 cm)  BEHAVIORAL SYMPTOMS/MOOD NEUROLOGICAL BOWEL NUTRITION STATUS   (None)  (None) Incontinent Diet (Heart)  AMBULATORY STATUS COMMUNICATION OF NEEDS Skin   Limited Assist Verbally Normal                       Personal Care Assistance Level of Assistance  Feeding, Dressing, Bathing Bathing Assistance: Limited assistance Feeding assistance: Independent       Functional Limitations Info  Sight, Hearing, Speech Sight Info: Adequate Hearing Info: Adequate Speech Info: Adequate    SPECIAL CARE FACTORS FREQUENCY   PT   2-3 times per week (home health)                   Contractures      Additional Factors Info  Code Status, Allergies Code Status Info:  (Full Code) Allergies Info:  (Codeine)          Discharge Medications: Please see discharge summary for a list of discharge medications. DISCHARGE MEDICATIONS:    Current Discharge Medication List    START taking these medications   Details  clindamycin (CLEOCIN) 300 MG capsule Take 1 capsule (300 mg total) by mouth every 6 (six) hours. Qty: 21 capsule, Refills: 0      CONTINUE these medications which have NOT CHANGED   Details  acetaminophen (TYLENOL) 500 MG tablet Take 500 mg by mouth every 6 (six) hours as needed for moderate pain.     calcium carbonate (OS-CAL) 600 MG TABS tablet Take 600 mg by mouth 2 (two) times daily with a meal.    meloxicam (MOBIC) 7.5 MG tablet Take 7.5 mg by mouth 2 (two) times daily.    mirabegron ER (MYRBETRIQ) 25 MG TB24 tablet Take 1 tablet (25 mg total) by mouth daily. Qty: 30 tablet, Refills: 6    omeprazole (PRILOSEC) 20 MG capsule Take 20 mg by mouth daily.         Relevant Imaging Results:  Relevant Lab Results:   Additional Information  (SSN [id number])  Haig ProphetMorgan, Kendrell Lottman G, LCSW

## 2016-02-20 NOTE — Discharge Summary (Addendum)
[facility] [place] [facility]   PATIENT NAME: Caroline Wilkerson    MR#:  [id number]  DATE OF BIRTH:  1923/05/22  DATE OF ADMISSION:  02/17/2016 ADMITTING PHYSICIAN: Altamese Dilling, MD  DATE OF DISCHARGE: 02/20/16  PRIMARY CARE PHYSICIAN: Vonita Moss, MD    ADMISSION DIAGNOSIS:  Right arm cellulitis [L03.113]  DISCHARGE DIAGNOSIS:  Right axillary Hidradenitis suppurativa Hypokalemia-improved dementia  SECONDARY DIAGNOSIS:   Past Medical History  Diagnosis Date  . Chronic kidney disease   . Vertigo   . Hypertension   . Hyperlipidemia   . Arthritis   . Alzheimer's dementia   . Cardiomegaly   . Hearing loss   . Fibrocystic breast     HOSPITAL COURSE:   Caroline Wilkerson is a 82 y.o. female with a known history of chronic kidney disease, hypertension, hyperlipidemia, Alzheimer's dementia, cardiomegaly, hearing deficit- lives in assisted living. Facility and at her baseline she is able to walk with her walker. Since yesterday she is not able to walk and feeling very weak  * right axillary Hidradenitis suppurativa with Cellulitis wound culture staph aureus blood cultures neg -White count trending down to 14,000 (18K) -Patient's abscess is improved. Patient feels a lot better. Hold a surgical consultation. -Change antibiotics to by mouth clindamycin to cover MRSA  * Generalized weakness Most likely secondary to cellulitis, treat underlying cause and get physical therapy evaluation.  * Hypertension  As per son she was recently taken off from her blood pressure medicine by PMD last week, as she had a fall and he suspected that pressure is running lower.  Currently the patient is normal so will keep her off of any medicine.   CONSULTS OBTAINED:     DRUG ALLERGIES:   Allergies  Allergen Reactions  . Codeine Diarrhea and Nausea And Vomiting    DISCHARGE MEDICATIONS:   Current Discharge Medication List    START taking these  medications   Details  clindamycin (CLEOCIN) 300 MG capsule Take 1 capsule (300 mg total) by mouth every 6 (six) hours. Qty: 21 capsule, Refills: 0      CONTINUE these medications which have NOT CHANGED   Details  acetaminophen (TYLENOL) 500 MG tablet Take 500 mg by mouth every 6 (six) hours as needed for moderate pain.     calcium carbonate (OS-CAL) 600 MG TABS tablet Take 600 mg by mouth 2 (two) times daily with a meal.    meloxicam (MOBIC) 7.5 MG tablet Take 7.5 mg by mouth 2 (two) times daily.    mirabegron ER (MYRBETRIQ) 25 MG TB24 tablet Take 1 tablet (25 mg total) by mouth daily. Qty: 30 tablet, Refills: 6    omeprazole (PRILOSEC) 20 MG capsule Take 20 mg by mouth daily.        If you experience worsening of your admission symptoms, develop shortness of breath, life threatening emergency, suicidal or homicidal thoughts you must seek medical attention immediately by calling 911 or calling your MD immediately  if symptoms less severe.  You Must read complete instructions/literature along with all the possible adverse reactions/side effects for all the Medicines you take and that have been prescribed to you. Take any new Medicines after you have completely understood and accept all the possible adverse reactions/side effects.   Please note  You were cared for by a hospitalist during your hospital stay. If you have any questions about your discharge medications or the care you received while you were in the hospital after you are discharged,  you can call the unit and asked to speak with the hospitalist on call if the hospitalist that took care of you is not available. Once you are discharged, your primary care physician will handle any further medical issues. Please note that NO REFILLS for any discharge medications will be authorized once you are discharged, as it is imperative that you return to your primary care physician (or establish a relationship with a primary care physician if  you do not have one) for your aftercare needs so that they can reassess your need for medications and monitor your lab values. Today   SUBJECTIVE  Doing ok. Some pai in the arm pit. No fever   VITAL SIGNS:  Blood pressure 136/56, pulse 88, temperature 98.6 F (37 C), temperature source Oral, resp. rate 20, height 5\' 1"  (1.549 m), weight 79.379 kg (175 lb), SpO2 93 %.  I/O:   Intake/Output Summary (Last 24 hours) at 02/20/16 1203 Last data filed at 02/19/16 1742  Gross per 24 hour  Intake     60 ml  Output      0 ml  Net     60 ml    PHYSICAL EXAMINATION:  GENERAL:  82 y.o.-year-old patient lying in the bed with no acute distress.  EYES: Pupils equal, round, reactive to light and accommodation. No scleral icterus. Extraocular muscles intact.  HEENT: Head atraumatic, normocephalic. Oropharynx and nasopharynx clear.  NECK:  Supple, no jugular venous distention. No thyroid enlargement, no tenderness.  LUNGS: Normal breath sounds bilaterally, no wheezing, rales,rhonchi or crepitation. No use of accessory muscles of respiration.  CARDIOVASCULAR: S1, S2 normal. No murmurs, rubs, or gallops.  ABDOMEN: Soft, non-tender, non-distended. Bowel sounds present. No organomegaly or mass.  EXTREMITIES: No pedal edema, cyanosis, or clubbing. cellulits resovled. Pus drainiage from right arm decreasing NEUROLOGIC: Cranial nerves II through XII are intact. Muscle strength 5/5 in all extremities. Sensation intact. Gait not checked.  PSYCHIATRIC: The patient is alert and oriented x 3.  SKIN: No obvious rash, lesion, or ulcer.   DATA REVIEW:   CBC   Recent Labs Lab 02/18/16 0552  WBC 14.0*  HGB 11.8*  HCT 34.8*  PLT 163    Chemistries   Recent Labs Lab 02/20/16 0601  NA 136  K 3.9  CL 100*  CO2 30  GLUCOSE 100*  BUN 23*  CREATININE 0.73  CALCIUM 8.5*  MG 1.9    Microbiology Results   Recent Results (from the past 240 hour(s))  Culture, blood (routine x 2)     Status: None  (Preliminary result)   Collection Time: 02/17/16 11:35 AM  Result Value Ref Range Status   Specimen Description BLOOD RIGHT AC  Final   Special Requests   Final    BOTTLES DRAWN AEROBIC AND ANAEROBIC AER 2ML ANA 1ML   Culture NO GROWTH 2 DAYS  Final   Report Status PENDING  Incomplete  Culture, blood (routine x 2)     Status: None (Preliminary result)   Collection Time: 02/17/16 11:35 AM  Result Value Ref Range Status   Specimen Description BLOOD LEFT WRIST  Final   Special Requests BOTTLES DRAWN AEROBIC AND ANAEROBIC 3ML  Final   Culture NO GROWTH 2 DAYS  Final   Report Status PENDING  Incomplete  MRSA PCR Screening     Status: Abnormal   Collection Time: 02/17/16  5:35 PM  Result Value Ref Range Status   MRSA by PCR POSITIVE (A) NEGATIVE Final    Comment:  The GeneXpert MRSA Assay (FDA approved for NASAL specimens only), is one component of a comprehensive MRSA colonization surveillance program. It is not intended to diagnose MRSA infection nor to guide or monitor treatment for MRSA infections. CRITICAL RESULT CALLED TO, READ BACK BY AND VERIFIED WITH: Candida Peeling RN AT 0981 02/17/16 MSS.   Wound culture     Status: None   Collection Time: 02/18/16  1:27 PM  Result Value Ref Range Status   Specimen Description AXILLA  Final   Special Requests Normal  Final   Gram Stain   Final    RARE WBC SEEN RARE GRAM NEGATIVE RODS RARE GRAM POSITIVE COCCI    Culture   Final    MODERATE GROWTH METHICILLIN RESISTANT STAPHYLOCOCCUS AUREUS   Report Status 02/20/2016 FINAL  Final   Organism ID, Bacteria METHICILLIN RESISTANT STAPHYLOCOCCUS AUREUS  Final      Susceptibility   Methicillin resistant staphylococcus aureus - MIC*    CIPROFLOXACIN >=8 RESISTANT Resistant     ERYTHROMYCIN >=8 RESISTANT Resistant     GENTAMICIN <=0.5 SENSITIVE Sensitive     OXACILLIN >=4 RESISTANT Resistant     TETRACYCLINE 2 SENSITIVE Sensitive     VANCOMYCIN 1 SENSITIVE Sensitive      TRIMETH/SULFA <=10 SENSITIVE Sensitive     CLINDAMYCIN <=0.25 SENSITIVE Sensitive     RIFAMPIN <=0.5 SENSITIVE Sensitive     Inducible Clindamycin NEGATIVE Sensitive     * MODERATE GROWTH METHICILLIN RESISTANT STAPHYLOCOCCUS AUREUS    RADIOLOGY:  No results found.   Management plans discussed with the patient, family and they are in agreement.  CODE STATUS:     Code Status Orders        Start     Ordered   02/17/16 1528  Full code   Continuous     02/17/16 1527    Code Status History    Date Active Date Inactive Code Status Order ID Comments User Context   This patient has a current code status but no historical code status.    Advance Directive Documentation        Most Recent Value   Type of Advance Directive  Healthcare Power of [place], Living will Henreitta Cea Georgina Pillion - Son/POA]   Pre-existing out of facility DNR order (yellow form or pink MOST form)     "MOST" Form in Place?        TOTAL TIME TAKING CARE OF THIS PATIENT: 40 minutes.    Terisa Belardo M.D on 02/20/2016 at 12:03 PM  Between 7am to 6pm - Pager - [phone] After 6pm go to [email or web address] - password [email or web address] [facility]  [place] [facility]  Office  [phone]  CC: Primary care physician; Vonita Moss, MD

## 2016-02-20 NOTE — ED Notes (Signed)
Patient transported to CT 

## 2016-02-20 NOTE — Discharge Instructions (Signed)
Change dressing bid wet To dry dressing

## 2016-02-20 NOTE — ED Provider Notes (Signed)
[facility] Emergency Department Provider Note  ____________________________________________  Time seen: Approximately 7:18 PM  I have reviewed the triage vital signs and the nursing notes.   HISTORY  Chief Complaint Fall  -History of present illness and review of systems Limited due to the patient's dementia. Information is obtained from her daughter at bedside.  HPI Caroline Wilkerson is a 82 y.o. female with history of lumbar dementia, chronic any disease, hypertension, hyperlipidemia who presents for evaluation of likely mechanical fall with head injury that occurred today at the [facility] of [address] Today. According to her daughter at bedside, the patient was discharged from [facility] this morning after treatment for generalized weakness as well as an abscess in the right axilla. She was returning to the [facility] and was confused and agitated and forgot that she had lived there for several years. Staff was attempting to get her settled in however she managed to make it out into the hallway without her walker and she fell, hitting her head, no loss of consciousness. No fevers or chills. The patient has no pain complaints at this time. No vomiting or diarrhea.   Past Medical History  Diagnosis Date  . Chronic kidney disease   . Vertigo   . Hypertension   . Hyperlipidemia   . Arthritis   . Alzheimer's dementia   . Cardiomegaly   . Hearing loss   . Fibrocystic breast     Patient Active Problem List   Diagnosis Date Noted  . Cellulitis 02/17/2016  . Generalized weakness 02/17/2016  . Decubitus ulcer of buttock, stage 1 10/21/2015  . Hypertension   . Hyperlipidemia   . Alzheimer's dementia     Past Surgical History  Procedure Laterality Date  . Abdominal hysterectomy    . Cholecystectomy    . Breast surgery    . Rotator cuff surgery Bilateral   . Carpal tunnel release    . Cataract extraction Bilateral   . Joint replacement Right     knee  and hip    Current Outpatient Rx  Name  Route  Sig  Dispense  Refill  . acetaminophen (TYLENOL) 500 MG tablet   Oral   Take 500 mg by mouth every 6 (six) hours as needed for moderate pain.          . calcium carbonate (OS-CAL) 600 MG TABS tablet   Oral   Take 600 mg by mouth 2 (two) times daily with a meal.         . clindamycin (CLEOCIN) 300 MG capsule   Oral   Take 1 capsule (300 mg total) by mouth every 6 (six) hours.   21 capsule   0   . meloxicam (MOBIC) 7.5 MG tablet   Oral   Take 7.5 mg by mouth 2 (two) times daily.         . mirabegron ER (MYRBETRIQ) 25 MG TB24 tablet   Oral   Take 1 tablet (25 mg total) by mouth daily.   30 tablet   6   . omeprazole (PRILOSEC) 20 MG capsule   Oral   Take 20 mg by mouth daily.           Allergies Codeine  Family History  Problem Relation Age of Onset  . Hypertension Daughter   . Hypertension Son   . Diabetes Son     Social History Social History  Substance Use Topics  . Smoking status: Never Smoker   . Smokeless tobacco: Never  Used  . Alcohol Use: No    Review of Systems Constitutional: No fever/chills Eyes: No visual changes. ENT: No sore throat. Cardiovascular: Denies chest pain. Respiratory: Denies shortness of breath. Gastrointestinal: No abdominal pain.  No nausea, no vomiting.  No diarrhea.  No constipation. Genitourinary: Negative for dysuria. Musculoskeletal: Negative for back pain. Skin: Negative for rash. Neurological: Negative for headaches, focal weakness or numbness.  10-point ROS otherwise negative.  ____________________________________________   PHYSICAL EXAM:  Filed Vitals:   02/20/16 2030 02/20/16 2056 02/20/16 2122 02/20/16 2130  BP: 162/76 153/80 134/70   Pulse:  120 99   Temp:    98.5 F (36.9 C)  TempSrc:    Oral  Resp: 22  20   SpO2:  95% 97%    Temp 98.5 F  VITAL SIGNS: ED Triage Vitals  Enc Vitals Group     BP --      Pulse --      Resp --      Temp --       Temp src --      SpO2 02/20/16 1910 95 %     Weight --      Height --      Head Cir --      Peak Flow --      Pain Score 02/20/16 1913 7     Pain Loc --      Pain Edu? --      Excl. in GC? --     Constitutional: Alert and oriented to self but not to place or situation or year. She is pleasantly demented at her baseline mental status according to her daughter who is at bedside. Eyes: Conjunctivae are normal. PERRL. EOMI. Head: Hematoma and the right frontal scalp. There is an associated hemostatic abrasion but no laceration. Nose: No congestion/rhinnorhea. Mouth/Throat: Mucous membranes are moist.  Oropharynx non-erythematous. Neck: No stridor. No cervical spine tenderness to palpation. Cardiovascular: Normal rate, regular rhythm. Grossly normal heart sounds.  Good peripheral circulation. Respiratory: Normal respiratory effort.  No retractions. Lungs CTAB. Gastrointestinal: Soft and nontender. No distention.  No CVA tenderness. Genitourinary: deferred Musculoskeletal: No lower extremity tenderness nor edema.  No joint effusions. Neurologic:  Normal speech and language. No gross focal neurologic deficits are appreciated. No gait instability when she ambulates with her walker. No midline T or L spine tenderness to palpation. Pelvis is stable to rock and compression. Full active painless range of motion of bilateral hip joints. Skin:  Skin is warm, dry and intact. No rash noted. Healing incision and drainage of right axillary abscess without purulent drainage or surrounding erythema. Psychiatric: Mood and affect are normal. Speech and behavior are normal.  ____________________________________________   LABS (all labs ordered are listed, but only abnormal results are displayed)  Labs Reviewed  CBC WITH DIFFERENTIAL/PLATELET - Abnormal; Notable for the following:    Neutro Abs 7.9 (*)    Monocytes Absolute 1.1 (*)    All other components within normal limits  BASIC METABOLIC PANEL  - Abnormal; Notable for the following:    Chloride 98 (*)    Glucose, Bld 115 (*)    BUN 24 (*)    Calcium 8.8 (*)    GFR calc non Af Amer 59 (*)    All other components within normal limits   ____________________________________________  EKG  ED ECG REPORT I, Gayla DossGayle, Dunia Pringle A, the attending physician, personally viewed and interpreted this ECG.   Date: 02/20/2016  EKG Time: 19:08  Rate:  94  Rhythm: normal sinus rhythm  Axis: normal  Intervals: Nonspecific intraventricular conduction delay  ST&T Change: No acute ST elevation. EKG unchanged from prior.  ____________________________________________  RADIOLOGY  CT head and c-spine IMPRESSION: 1. Right frontal scalp hematoma. No calvarial fracture or acute intracranial abnormality. 2. No acute fracture subluxation of the cervical spine. 3. Stable atrophy and chronic small vessel ischemia. Advanced multilevel degenerative change throughout cervical spine.   ____________________________________________   PROCEDURES  Procedure(s) performed: None  Critical Care performed: No  ____________________________________________   INITIAL IMPRESSION / ASSESSMENT AND PLAN / ED COURSE  Pertinent labs & imaging results that were available during my care of the patient were reviewed by me and considered in my medical decision making (see chart for details).  Caroline Wilkerson is a 82 y.o. female with history of lumbar dementia, chronic any disease, hypertension, hyperlipidemia who presents for evaluation of likely mechanical fall with head injury that occurred today at the [facility] of [address] On exam, she is well-appearing and in no acute distress. Vital signs stable, she is afebrile. She has evidence of minor trauma to her head but otherwise her exam is atraumatic and she denies any pain complaints. She is at her baseline in terms of mental status. EKG unchanged from prior. CBC unremarkable. BMP also reassuring. CT head and C-spine negative  for any acute traumatic pathology with the exception of a small hematoma. Her tetanus is up-to-date. She ambulates well with her walker. DC with return precautions. Her family at bedside is comfortable with the discharge plan. ____________________________________________   FINAL CLINICAL IMPRESSION(S) / ED DIAGNOSES  Final diagnoses:  Fall, initial encounter  Head injury due to trauma, initial encounter      Gayla Doss, MD 02/20/16 2131

## 2016-02-20 NOTE — Progress Notes (Signed)
MEDICATION RELATED CONSULT NOTE -follow up  Pharmacy Consult for electrolyte monitoring and replacement Indication: hypokalemia  Allergies  Allergen Reactions  . Codeine Diarrhea and Nausea And Vomiting   Patient Measurements: Height: 5\' 1"  (154.9 cm) Weight: 175 lb (79.379 kg) IBW/kg (Calculated) : 47.8  Vital Signs: Temp: 99 F (37.2 C) (03/10 0413) Temp Source: Oral (03/09 2108) BP: 126/48 mmHg (03/10 0413) Pulse Rate: 70 (03/10 0413) Intake/Output from previous day: 03/09 0701 - 03/10 0700 In: 60 [P.O.:60] Out: 125 [Urine:125] Intake/Output from this shift:    Labs:  Recent Labs  02/17/16 1021 02/18/16 0552 02/19/16 0606 02/20/16 0601  WBC 18.6* 14.0*  --   --   HGB 13.9 11.8*  --   --   HCT 41.2 34.8*  --   --   PLT 189 163  --   --   CREATININE 0.94 0.83 0.65 0.73  MG  --  1.6* 1.7 1.9  PHOS  --   --  2.9  --    Estimated Creatinine Clearance: 44.6 mL/min (by C-G formula based on Cr of 0.73).  Medical History: Past Medical History  Diagnosis Date  . Chronic kidney disease   . Vertigo   . Hypertension   . Hyperlipidemia   . Arthritis   . Alzheimer's dementia   . Cardiomegaly   . Hearing loss   . Fibrocystic breast    Assessment: Pharmacy consulted to monitor and replace electrolytes in this 82 year old female for hypokalemia. Patient is not currently on any diuretics.   K = 3.9 (lWNL) Mg = 1.9 (WNL)  Goal of Therapy:  Electrolytes within normal limits  Plan:  Magnesium/Potassium WNL Will recheck K/Mg with AM labs tomorrow.  Thank you for allowing pharmacy to be part of this patient's care.  Angelique BlonderMerrill,Liany Mumpower A, PharmD Clinical Pharmacist 02/20/2016,7:39 AM

## 2016-02-20 NOTE — ED Notes (Signed)
Discussed IV options with patient, AC placement preferred. 

## 2016-02-20 NOTE — Progress Notes (Signed)
Patient is medically stable for D/C back to [facility] today. Per Caroline Wilkerson Med Tech patient can return today. Clinical Child psychotherapistocial Worker (CSW) sent D/C Summary, FL2 and prescriptions to [facility]. Patient's son Caroline Wilkerson will provide transport. RN Case Manager made arrangements for a wheel chair and home health. RN aware of above. Please reconsult if future social work needs arise. CSW signing off.   Jetta LoutBailey Morgan, LCSW [phone]

## 2016-02-20 NOTE — ED Notes (Signed)
Patient returned from CT

## 2016-02-20 NOTE — Progress Notes (Signed)
DISCHARGE NOTE:  Pt son and daughter at the bedside. Packet handed to son, to give to facility. (Copy of AVS given to son). Wheelchair sent with pt. Pt wheeled to car by staff.

## 2016-02-20 NOTE — Progress Notes (Signed)
Physical Therapy Treatment Patient Details Name: Caroline Wilkerson MRN: [id number] DOB: 11/10/1923 Today's Date: 02/20/2016    History of Present Illness presented to ER secondary to generalized weakness and redness over R axilla; admitted for management of R axillary cellulitis.    PT Comments    Patient with improved mobility tolerance and overall functional performance this date.  Able to complete gait distances up to 50' with Rw, min assist +1 (second person chair follow for safety); fair response to verbal cuing for increased step height/length, but unable to maintain or carry-over indep.  Additional distance limited by fatigue. Continue to recommend RW and +1 assist at all times, but anticipate continued improvement in performance. Sats maintained >92% on RA at rest and with exertion throughout session.  Follow Up Recommendations  Home health PT;Supervision/Assistance - 24 hour     Equipment Recommendations  Rolling walker with 5" wheels;Wheelchair cushion (measurements PT);Wheelchair (measurements PT)    Recommendations for Other Services       Precautions / Restrictions Precautions Precautions: Fall Precaution Comments: contact isolation Restrictions Weight Bearing Restrictions: No    Mobility  Bed Mobility Overal bed mobility: Needs Assistance Bed Mobility: Supine to Sit     Supine to sit: Supervision     General bed mobility comments: increased time, effort to complete, but able to do so without physical assist from therapist this date  Transfers Overall transfer level: Needs assistance Equipment used: Rolling walker (2 wheeled) Transfers: Sit to/from Stand Sit to Stand: Min assist         General transfer comment: tends to pull on RW despite cuing  Ambulation/Gait Ambulation/Gait assistance: Min assist;+2 safety/equipment Ambulation Distance (Feet): 25 Feet Assistive device: Rolling walker (2 wheeled)     Gait velocity interpretation: <1.8 ft/sec,  indicative of risk for recurrent falls General Gait Details: broad BOS with short, shuffing steps; excessive lateral sway.  min assist for walker management, esp with turns.  Does respond to verbal cuing for increased step height/length, but unable to sustain without repetitive instruction.   Stairs            Wheelchair Mobility    Modified Rankin (Stroke Patients Only)       Balance Overall balance assessment: Needs assistance Sitting-balance support: No upper extremity supported;Feet supported Sitting balance-Leahy Scale: Good     Standing balance support: Bilateral upper extremity supported Standing balance-Leahy Scale: Fair                      Cognition Arousal/Alertness: Awake/alert Behavior During Therapy: WFL for tasks assessed/performed Overall Cognitive Status: History of cognitive impairments - at baseline                      Exercises Other Exercises Other Exercises: Additional gait trial x50' with RW, min assist +1 (chair follow for safety)--mechanics as noted above Other Exercises: Sit/stand from edge of bed, recliner with RW, min assist--requires UE support to complete from all surfaces    General Comments        Pertinent Vitals/Pain Pain Assessment: No/denies pain    Home Living                      Prior Function            PT Goals (current goals can now be found in the care plan section) Acute Rehab PT Goals Patient Stated Goal: per family, hope for patient to return to ALF PT  Goal Formulation: Patient unable to participate in goal setting Time For Goal Achievement: 03/04/16 Potential to Achieve Goals: Fair Progress towards PT goals: Progressing toward goals    Frequency  Min 2X/week    PT Plan Current plan remains appropriate    Co-evaluation             End of Session Equipment Utilized During Treatment: Gait belt Activity Tolerance: Patient tolerated treatment well Patient left: in bed;with  call bell/phone within reach;with chair alarm set;with family/visitor present     Time: 4098-1191 PT Time Calculation (min) (ACUTE ONLY): 21 min  Charges:  $Gait Training: 8-22 mins                    G Codes:      Caroline Wilkerson, PT, DPT, NCS 02/20/2016, 10:06 AM [phone]

## 2016-02-20 NOTE — Care Management (Signed)
Wheelchair delivered. I have notified Abby with [facility] of patient discharge today. Case closed.

## 2016-02-20 NOTE — ED Notes (Signed)
Per EMS patient had unwitnessed fall at the [facility].  Patient was just d/c after a 4 day stay for weakness.  Pt has a lac on the back of her head and a hematoma on the right side of her head.  Pt complains of HA pain 7/10.  Pt denies losign consciousness, pupils are PERRLA per EMS.  Pt is AOx4.

## 2016-02-22 LAB — CULTURE, BLOOD (ROUTINE X 2)
Culture: NO GROWTH
Culture: NO GROWTH

## 2016-02-23 ENCOUNTER — Telehealth: Payer: Self-pay | Admitting: Family Medicine

## 2016-02-23 DIAGNOSIS — B351 Tinea unguium: Secondary | ICD-10-CM | POA: Diagnosis not present

## 2016-02-23 DIAGNOSIS — R410 Disorientation, unspecified: Secondary | ICD-10-CM

## 2016-02-23 DIAGNOSIS — L600 Ingrowing nail: Secondary | ICD-10-CM | POA: Diagnosis not present

## 2016-02-23 DIAGNOSIS — M797 Fibromyalgia: Secondary | ICD-10-CM | POA: Diagnosis not present

## 2016-02-23 DIAGNOSIS — R2681 Unsteadiness on feet: Secondary | ICD-10-CM | POA: Diagnosis not present

## 2016-02-23 MED ORDER — MIRTAZAPINE 15 MG PO TBDP: 15.0000 mg | ORAL_TABLET | Freq: Every evening | ORAL | Status: DC | PRN

## 2016-02-23 NOTE — Telephone Encounter (Signed)
Order U/A plz

## 2016-02-23 NOTE — Progress Notes (Signed)
Clinical Child psychotherapistocial Worker (CSW) received call from [facility] Care Coordinator at [facility] on 02/23/16. Per Amber patient discharged back to [facility] Friday 02/20/16 and they did not have the D/C orders. CSW faxed D/C orders to [facility] on Friday 02/20/16 and sent D/C orders in D/C Packet. CSW re-sent D/C Summary and FL2 via [facility] to [facility] today 02/23/16.   Jetta LoutBailey Morgan, LCSW [phone]

## 2016-02-23 NOTE — Telephone Encounter (Signed)
Pt's daughter in law is in the office stated the pt's Assisted Living facility would like an order to put alarms on her chair as well as her person as she is falling. Is there any way this order can be written now. Pt's daughter in law is in the lobby waiting. Please advise. Daughter in law is included in pt's POA. Thanks.

## 2016-02-23 NOTE — Telephone Encounter (Signed)
Will bring Urine sample in

## 2016-02-23 NOTE — Telephone Encounter (Signed)
Marylene Landngela from the [facility] of [address] called and stated that the pt was very confused and they would like to get a order placed for a urine.

## 2016-02-23 NOTE — Telephone Encounter (Signed)
Caroline Wilkerson has concerns about his mom that he would like to discuss with Dr Dossie Arbourrissman. Didn't give any details.

## 2016-02-23 NOTE — Telephone Encounter (Signed)
Pt's daughter in law requests that order be faxed to:  [facility] [place]  Fax # [phone]  Attn: Amber/ SIC  Please mark as URGENT!  Thanks.  Pt's daughter in laws # was included with the original message. Thanks.

## 2016-02-23 NOTE — Telephone Encounter (Signed)
Caroline Wilkerson called and would like to get an order for chair alarm and medication for anxiety. Caroline Wilkerson stated that the hospital had suggested remeron but didn't order it.

## 2016-02-24 ENCOUNTER — Telehealth: Payer: Self-pay | Admitting: Family Medicine

## 2016-02-24 DIAGNOSIS — M1990 Unspecified osteoarthritis, unspecified site: Secondary | ICD-10-CM | POA: Diagnosis not present

## 2016-02-24 DIAGNOSIS — L03111 Cellulitis of right axilla: Secondary | ICD-10-CM | POA: Diagnosis not present

## 2016-02-24 DIAGNOSIS — G309 Alzheimer's disease, unspecified: Secondary | ICD-10-CM | POA: Diagnosis not present

## 2016-02-24 DIAGNOSIS — R69 Illness, unspecified: Secondary | ICD-10-CM | POA: Diagnosis not present

## 2016-02-24 DIAGNOSIS — B9562 Methicillin resistant Staphylococcus aureus infection as the cause of diseases classified elsewhere: Secondary | ICD-10-CM | POA: Diagnosis not present

## 2016-02-24 DIAGNOSIS — L732 Hidradenitis suppurativa: Secondary | ICD-10-CM | POA: Diagnosis not present

## 2016-02-24 NOTE — Telephone Encounter (Signed)
Discussed with family and nursing home will hold physical therapy for now have house doctor to assess patient and also abscess under arm. Patient will use Remeron Chair alarm

## 2016-02-24 NOTE — Telephone Encounter (Signed)
Caroline Wilkerson called and would like to get an order for PT 3 times a week for 4 weeks followed by 2 times a week for 3 weeks.

## 2016-02-25 ENCOUNTER — Telehealth: Payer: Self-pay

## 2016-02-25 NOTE — Telephone Encounter (Signed)
Patient was given Clindamycin 02/20/16 at ED Written to take q6h  They have been giving to patient q8h, so she still has remaining pills,  Does she continue until gone?  If so, will need order

## 2016-02-25 NOTE — Telephone Encounter (Signed)
Continue medication until gone

## 2016-02-26 ENCOUNTER — Inpatient Hospital Stay: Payer: Medicare HMO | Admitting: Family Medicine

## 2016-02-26 DIAGNOSIS — L03111 Cellulitis of right axilla: Secondary | ICD-10-CM | POA: Diagnosis not present

## 2016-02-26 DIAGNOSIS — G309 Alzheimer's disease, unspecified: Secondary | ICD-10-CM | POA: Diagnosis not present

## 2016-02-26 DIAGNOSIS — M1990 Unspecified osteoarthritis, unspecified site: Secondary | ICD-10-CM | POA: Diagnosis not present

## 2016-02-26 DIAGNOSIS — R262 Difficulty in walking, not elsewhere classified: Secondary | ICD-10-CM | POA: Diagnosis not present

## 2016-02-26 DIAGNOSIS — R609 Edema, unspecified: Secondary | ICD-10-CM | POA: Diagnosis not present

## 2016-02-26 DIAGNOSIS — L732 Hidradenitis suppurativa: Secondary | ICD-10-CM | POA: Diagnosis not present

## 2016-02-26 DIAGNOSIS — I10 Essential (primary) hypertension: Secondary | ICD-10-CM | POA: Diagnosis not present

## 2016-02-26 DIAGNOSIS — B9562 Methicillin resistant Staphylococcus aureus infection as the cause of diseases classified elsewhere: Secondary | ICD-10-CM | POA: Diagnosis not present

## 2016-02-26 DIAGNOSIS — L02421 Furuncle of right axilla: Secondary | ICD-10-CM | POA: Diagnosis not present

## 2016-02-26 DIAGNOSIS — R69 Illness, unspecified: Secondary | ICD-10-CM | POA: Diagnosis not present

## 2016-02-26 DIAGNOSIS — M545 Low back pain: Secondary | ICD-10-CM | POA: Diagnosis not present

## 2016-03-01 DIAGNOSIS — R69 Illness, unspecified: Secondary | ICD-10-CM | POA: Diagnosis not present

## 2016-03-01 DIAGNOSIS — L03111 Cellulitis of right axilla: Secondary | ICD-10-CM | POA: Diagnosis not present

## 2016-03-01 DIAGNOSIS — M1990 Unspecified osteoarthritis, unspecified site: Secondary | ICD-10-CM | POA: Diagnosis not present

## 2016-03-01 DIAGNOSIS — L732 Hidradenitis suppurativa: Secondary | ICD-10-CM | POA: Diagnosis not present

## 2016-03-01 DIAGNOSIS — B9562 Methicillin resistant Staphylococcus aureus infection as the cause of diseases classified elsewhere: Secondary | ICD-10-CM | POA: Diagnosis not present

## 2016-03-01 DIAGNOSIS — G309 Alzheimer's disease, unspecified: Secondary | ICD-10-CM | POA: Diagnosis not present

## 2016-03-03 DIAGNOSIS — G309 Alzheimer's disease, unspecified: Secondary | ICD-10-CM | POA: Diagnosis not present

## 2016-03-03 DIAGNOSIS — L03111 Cellulitis of right axilla: Secondary | ICD-10-CM | POA: Diagnosis not present

## 2016-03-03 DIAGNOSIS — R69 Illness, unspecified: Secondary | ICD-10-CM | POA: Diagnosis not present

## 2016-03-03 DIAGNOSIS — M1990 Unspecified osteoarthritis, unspecified site: Secondary | ICD-10-CM | POA: Diagnosis not present

## 2016-03-03 DIAGNOSIS — B9562 Methicillin resistant Staphylococcus aureus infection as the cause of diseases classified elsewhere: Secondary | ICD-10-CM | POA: Diagnosis not present

## 2016-03-03 DIAGNOSIS — L732 Hidradenitis suppurativa: Secondary | ICD-10-CM | POA: Diagnosis not present

## 2016-03-04 DIAGNOSIS — S41109A Unspecified open wound of unspecified upper arm, initial encounter: Secondary | ICD-10-CM | POA: Diagnosis not present

## 2016-03-04 DIAGNOSIS — R262 Difficulty in walking, not elsewhere classified: Secondary | ICD-10-CM | POA: Diagnosis not present

## 2016-03-04 DIAGNOSIS — M545 Low back pain: Secondary | ICD-10-CM | POA: Diagnosis not present

## 2016-03-04 DIAGNOSIS — M1990 Unspecified osteoarthritis, unspecified site: Secondary | ICD-10-CM | POA: Diagnosis not present

## 2016-03-04 DIAGNOSIS — I10 Essential (primary) hypertension: Secondary | ICD-10-CM | POA: Diagnosis not present

## 2016-03-04 DIAGNOSIS — Z9181 History of falling: Secondary | ICD-10-CM | POA: Diagnosis not present

## 2016-03-05 DIAGNOSIS — G309 Alzheimer's disease, unspecified: Secondary | ICD-10-CM | POA: Diagnosis not present

## 2016-03-05 DIAGNOSIS — B9562 Methicillin resistant Staphylococcus aureus infection as the cause of diseases classified elsewhere: Secondary | ICD-10-CM | POA: Diagnosis not present

## 2016-03-05 DIAGNOSIS — R69 Illness, unspecified: Secondary | ICD-10-CM | POA: Diagnosis not present

## 2016-03-05 DIAGNOSIS — L732 Hidradenitis suppurativa: Secondary | ICD-10-CM | POA: Diagnosis not present

## 2016-03-05 DIAGNOSIS — L03111 Cellulitis of right axilla: Secondary | ICD-10-CM | POA: Diagnosis not present

## 2016-03-05 DIAGNOSIS — M1990 Unspecified osteoarthritis, unspecified site: Secondary | ICD-10-CM | POA: Diagnosis not present

## 2016-03-06 DIAGNOSIS — R69 Illness, unspecified: Secondary | ICD-10-CM | POA: Diagnosis not present

## 2016-03-06 DIAGNOSIS — M1990 Unspecified osteoarthritis, unspecified site: Secondary | ICD-10-CM | POA: Diagnosis not present

## 2016-03-06 DIAGNOSIS — B9562 Methicillin resistant Staphylococcus aureus infection as the cause of diseases classified elsewhere: Secondary | ICD-10-CM | POA: Diagnosis not present

## 2016-03-06 DIAGNOSIS — G309 Alzheimer's disease, unspecified: Secondary | ICD-10-CM | POA: Diagnosis not present

## 2016-03-06 DIAGNOSIS — L03111 Cellulitis of right axilla: Secondary | ICD-10-CM | POA: Diagnosis not present

## 2016-03-06 DIAGNOSIS — L732 Hidradenitis suppurativa: Secondary | ICD-10-CM | POA: Diagnosis not present

## 2016-03-08 DIAGNOSIS — R69 Illness, unspecified: Secondary | ICD-10-CM | POA: Diagnosis not present

## 2016-03-08 DIAGNOSIS — L03111 Cellulitis of right axilla: Secondary | ICD-10-CM | POA: Diagnosis not present

## 2016-03-08 DIAGNOSIS — B9562 Methicillin resistant Staphylococcus aureus infection as the cause of diseases classified elsewhere: Secondary | ICD-10-CM | POA: Diagnosis not present

## 2016-03-08 DIAGNOSIS — L732 Hidradenitis suppurativa: Secondary | ICD-10-CM | POA: Diagnosis not present

## 2016-03-08 DIAGNOSIS — G309 Alzheimer's disease, unspecified: Secondary | ICD-10-CM | POA: Diagnosis not present

## 2016-03-08 DIAGNOSIS — M1990 Unspecified osteoarthritis, unspecified site: Secondary | ICD-10-CM | POA: Diagnosis not present

## 2016-03-10 DIAGNOSIS — B9562 Methicillin resistant Staphylococcus aureus infection as the cause of diseases classified elsewhere: Secondary | ICD-10-CM | POA: Diagnosis not present

## 2016-03-10 DIAGNOSIS — M1990 Unspecified osteoarthritis, unspecified site: Secondary | ICD-10-CM | POA: Diagnosis not present

## 2016-03-10 DIAGNOSIS — G309 Alzheimer's disease, unspecified: Secondary | ICD-10-CM | POA: Diagnosis not present

## 2016-03-10 DIAGNOSIS — R69 Illness, unspecified: Secondary | ICD-10-CM | POA: Diagnosis not present

## 2016-03-10 DIAGNOSIS — L03111 Cellulitis of right axilla: Secondary | ICD-10-CM | POA: Diagnosis not present

## 2016-03-10 DIAGNOSIS — L732 Hidradenitis suppurativa: Secondary | ICD-10-CM | POA: Diagnosis not present

## 2016-03-11 DIAGNOSIS — R69 Illness, unspecified: Secondary | ICD-10-CM | POA: Diagnosis not present

## 2016-03-11 DIAGNOSIS — B9562 Methicillin resistant Staphylococcus aureus infection as the cause of diseases classified elsewhere: Secondary | ICD-10-CM | POA: Diagnosis not present

## 2016-03-11 DIAGNOSIS — M1990 Unspecified osteoarthritis, unspecified site: Secondary | ICD-10-CM | POA: Diagnosis not present

## 2016-03-11 DIAGNOSIS — L03111 Cellulitis of right axilla: Secondary | ICD-10-CM | POA: Diagnosis not present

## 2016-03-11 DIAGNOSIS — L732 Hidradenitis suppurativa: Secondary | ICD-10-CM | POA: Diagnosis not present

## 2016-03-11 DIAGNOSIS — G309 Alzheimer's disease, unspecified: Secondary | ICD-10-CM | POA: Diagnosis not present

## 2016-03-12 DIAGNOSIS — L03111 Cellulitis of right axilla: Secondary | ICD-10-CM | POA: Diagnosis not present

## 2016-03-12 DIAGNOSIS — G309 Alzheimer's disease, unspecified: Secondary | ICD-10-CM | POA: Diagnosis not present

## 2016-03-12 DIAGNOSIS — L732 Hidradenitis suppurativa: Secondary | ICD-10-CM | POA: Diagnosis not present

## 2016-03-12 DIAGNOSIS — B9562 Methicillin resistant Staphylococcus aureus infection as the cause of diseases classified elsewhere: Secondary | ICD-10-CM | POA: Diagnosis not present

## 2016-03-12 DIAGNOSIS — R69 Illness, unspecified: Secondary | ICD-10-CM | POA: Diagnosis not present

## 2016-03-12 DIAGNOSIS — M1990 Unspecified osteoarthritis, unspecified site: Secondary | ICD-10-CM | POA: Diagnosis not present

## 2016-03-15 DIAGNOSIS — R69 Illness, unspecified: Secondary | ICD-10-CM | POA: Diagnosis not present

## 2016-03-15 DIAGNOSIS — L732 Hidradenitis suppurativa: Secondary | ICD-10-CM | POA: Diagnosis not present

## 2016-03-15 DIAGNOSIS — M1990 Unspecified osteoarthritis, unspecified site: Secondary | ICD-10-CM | POA: Diagnosis not present

## 2016-03-15 DIAGNOSIS — G309 Alzheimer's disease, unspecified: Secondary | ICD-10-CM | POA: Diagnosis not present

## 2016-03-15 DIAGNOSIS — L03111 Cellulitis of right axilla: Secondary | ICD-10-CM | POA: Diagnosis not present

## 2016-03-15 DIAGNOSIS — B9562 Methicillin resistant Staphylococcus aureus infection as the cause of diseases classified elsewhere: Secondary | ICD-10-CM | POA: Diagnosis not present

## 2016-03-17 DIAGNOSIS — L03111 Cellulitis of right axilla: Secondary | ICD-10-CM | POA: Diagnosis not present

## 2016-03-17 DIAGNOSIS — G309 Alzheimer's disease, unspecified: Secondary | ICD-10-CM | POA: Diagnosis not present

## 2016-03-17 DIAGNOSIS — L732 Hidradenitis suppurativa: Secondary | ICD-10-CM | POA: Diagnosis not present

## 2016-03-17 DIAGNOSIS — R69 Illness, unspecified: Secondary | ICD-10-CM | POA: Diagnosis not present

## 2016-03-17 DIAGNOSIS — M1990 Unspecified osteoarthritis, unspecified site: Secondary | ICD-10-CM | POA: Diagnosis not present

## 2016-03-17 DIAGNOSIS — B9562 Methicillin resistant Staphylococcus aureus infection as the cause of diseases classified elsewhere: Secondary | ICD-10-CM | POA: Diagnosis not present

## 2016-03-18 DIAGNOSIS — M545 Low back pain: Secondary | ICD-10-CM | POA: Diagnosis not present

## 2016-03-18 DIAGNOSIS — Z9189 Other specified personal risk factors, not elsewhere classified: Secondary | ICD-10-CM | POA: Diagnosis not present

## 2016-03-18 DIAGNOSIS — M1990 Unspecified osteoarthritis, unspecified site: Secondary | ICD-10-CM | POA: Diagnosis not present

## 2016-03-18 DIAGNOSIS — L03111 Cellulitis of right axilla: Secondary | ICD-10-CM | POA: Diagnosis not present

## 2016-03-18 DIAGNOSIS — I10 Essential (primary) hypertension: Secondary | ICD-10-CM | POA: Diagnosis not present

## 2016-03-18 DIAGNOSIS — G309 Alzheimer's disease, unspecified: Secondary | ICD-10-CM | POA: Diagnosis not present

## 2016-03-18 DIAGNOSIS — R262 Difficulty in walking, not elsewhere classified: Secondary | ICD-10-CM | POA: Diagnosis not present

## 2016-03-18 DIAGNOSIS — W19XXXA Unspecified fall, initial encounter: Secondary | ICD-10-CM | POA: Diagnosis not present

## 2016-03-18 DIAGNOSIS — L732 Hidradenitis suppurativa: Secondary | ICD-10-CM | POA: Diagnosis not present

## 2016-03-18 DIAGNOSIS — B9562 Methicillin resistant Staphylococcus aureus infection as the cause of diseases classified elsewhere: Secondary | ICD-10-CM | POA: Diagnosis not present

## 2016-03-18 DIAGNOSIS — R69 Illness, unspecified: Secondary | ICD-10-CM | POA: Diagnosis not present

## 2016-03-19 DIAGNOSIS — L03111 Cellulitis of right axilla: Secondary | ICD-10-CM | POA: Diagnosis not present

## 2016-03-19 DIAGNOSIS — L732 Hidradenitis suppurativa: Secondary | ICD-10-CM | POA: Diagnosis not present

## 2016-03-19 DIAGNOSIS — B9562 Methicillin resistant Staphylococcus aureus infection as the cause of diseases classified elsewhere: Secondary | ICD-10-CM | POA: Diagnosis not present

## 2016-03-19 DIAGNOSIS — G309 Alzheimer's disease, unspecified: Secondary | ICD-10-CM | POA: Diagnosis not present

## 2016-03-19 DIAGNOSIS — R69 Illness, unspecified: Secondary | ICD-10-CM | POA: Diagnosis not present

## 2016-03-19 DIAGNOSIS — M1990 Unspecified osteoarthritis, unspecified site: Secondary | ICD-10-CM | POA: Diagnosis not present

## 2016-03-22 DIAGNOSIS — B9562 Methicillin resistant Staphylococcus aureus infection as the cause of diseases classified elsewhere: Secondary | ICD-10-CM | POA: Diagnosis not present

## 2016-03-22 DIAGNOSIS — L03111 Cellulitis of right axilla: Secondary | ICD-10-CM | POA: Diagnosis not present

## 2016-03-22 DIAGNOSIS — M1990 Unspecified osteoarthritis, unspecified site: Secondary | ICD-10-CM | POA: Diagnosis not present

## 2016-03-22 DIAGNOSIS — E785 Hyperlipidemia, unspecified: Secondary | ICD-10-CM | POA: Diagnosis not present

## 2016-03-22 DIAGNOSIS — Z96649 Presence of unspecified artificial hip joint: Secondary | ICD-10-CM | POA: Diagnosis not present

## 2016-03-22 DIAGNOSIS — L732 Hidradenitis suppurativa: Secondary | ICD-10-CM | POA: Diagnosis not present

## 2016-03-22 DIAGNOSIS — G309 Alzheimer's disease, unspecified: Secondary | ICD-10-CM | POA: Diagnosis not present

## 2016-03-22 DIAGNOSIS — Q659 Congenital deformity of hip, unspecified: Secondary | ICD-10-CM | POA: Diagnosis not present

## 2016-03-22 DIAGNOSIS — R69 Illness, unspecified: Secondary | ICD-10-CM | POA: Diagnosis not present

## 2016-03-22 DIAGNOSIS — L0390 Cellulitis, unspecified: Secondary | ICD-10-CM | POA: Diagnosis not present

## 2016-03-24 DIAGNOSIS — G309 Alzheimer's disease, unspecified: Secondary | ICD-10-CM | POA: Diagnosis not present

## 2016-03-24 DIAGNOSIS — L03111 Cellulitis of right axilla: Secondary | ICD-10-CM | POA: Diagnosis not present

## 2016-03-24 DIAGNOSIS — M1990 Unspecified osteoarthritis, unspecified site: Secondary | ICD-10-CM | POA: Diagnosis not present

## 2016-03-24 DIAGNOSIS — B9562 Methicillin resistant Staphylococcus aureus infection as the cause of diseases classified elsewhere: Secondary | ICD-10-CM | POA: Diagnosis not present

## 2016-03-24 DIAGNOSIS — L732 Hidradenitis suppurativa: Secondary | ICD-10-CM | POA: Diagnosis not present

## 2016-03-24 DIAGNOSIS — R69 Illness, unspecified: Secondary | ICD-10-CM | POA: Diagnosis not present

## 2016-03-25 DIAGNOSIS — M1990 Unspecified osteoarthritis, unspecified site: Secondary | ICD-10-CM | POA: Diagnosis not present

## 2016-03-25 DIAGNOSIS — R69 Illness, unspecified: Secondary | ICD-10-CM | POA: Diagnosis not present

## 2016-03-25 DIAGNOSIS — B9562 Methicillin resistant Staphylococcus aureus infection as the cause of diseases classified elsewhere: Secondary | ICD-10-CM | POA: Diagnosis not present

## 2016-03-25 DIAGNOSIS — G309 Alzheimer's disease, unspecified: Secondary | ICD-10-CM | POA: Diagnosis not present

## 2016-03-25 DIAGNOSIS — L732 Hidradenitis suppurativa: Secondary | ICD-10-CM | POA: Diagnosis not present

## 2016-03-25 DIAGNOSIS — L03111 Cellulitis of right axilla: Secondary | ICD-10-CM | POA: Diagnosis not present

## 2016-03-26 DIAGNOSIS — M1990 Unspecified osteoarthritis, unspecified site: Secondary | ICD-10-CM | POA: Diagnosis not present

## 2016-03-26 DIAGNOSIS — L732 Hidradenitis suppurativa: Secondary | ICD-10-CM | POA: Diagnosis not present

## 2016-03-26 DIAGNOSIS — R69 Illness, unspecified: Secondary | ICD-10-CM | POA: Diagnosis not present

## 2016-03-26 DIAGNOSIS — G309 Alzheimer's disease, unspecified: Secondary | ICD-10-CM | POA: Diagnosis not present

## 2016-03-26 DIAGNOSIS — B9562 Methicillin resistant Staphylococcus aureus infection as the cause of diseases classified elsewhere: Secondary | ICD-10-CM | POA: Diagnosis not present

## 2016-03-26 DIAGNOSIS — L03111 Cellulitis of right axilla: Secondary | ICD-10-CM | POA: Diagnosis not present

## 2016-03-29 DIAGNOSIS — M1990 Unspecified osteoarthritis, unspecified site: Secondary | ICD-10-CM | POA: Diagnosis not present

## 2016-03-29 DIAGNOSIS — L732 Hidradenitis suppurativa: Secondary | ICD-10-CM | POA: Diagnosis not present

## 2016-03-29 DIAGNOSIS — L03111 Cellulitis of right axilla: Secondary | ICD-10-CM | POA: Diagnosis not present

## 2016-03-29 DIAGNOSIS — G309 Alzheimer's disease, unspecified: Secondary | ICD-10-CM | POA: Diagnosis not present

## 2016-03-29 DIAGNOSIS — B9562 Methicillin resistant Staphylococcus aureus infection as the cause of diseases classified elsewhere: Secondary | ICD-10-CM | POA: Diagnosis not present

## 2016-03-29 DIAGNOSIS — R69 Illness, unspecified: Secondary | ICD-10-CM | POA: Diagnosis not present

## 2016-03-30 DIAGNOSIS — L732 Hidradenitis suppurativa: Secondary | ICD-10-CM | POA: Diagnosis not present

## 2016-03-30 DIAGNOSIS — R609 Edema, unspecified: Secondary | ICD-10-CM | POA: Diagnosis not present

## 2016-03-30 DIAGNOSIS — S00412A Abrasion of left ear, initial encounter: Secondary | ICD-10-CM | POA: Diagnosis not present

## 2016-03-30 DIAGNOSIS — B9562 Methicillin resistant Staphylococcus aureus infection as the cause of diseases classified elsewhere: Secondary | ICD-10-CM | POA: Diagnosis not present

## 2016-03-30 DIAGNOSIS — H9110 Presbycusis, unspecified ear: Secondary | ICD-10-CM | POA: Diagnosis not present

## 2016-03-30 DIAGNOSIS — I10 Essential (primary) hypertension: Secondary | ICD-10-CM | POA: Diagnosis not present

## 2016-03-30 DIAGNOSIS — G309 Alzheimer's disease, unspecified: Secondary | ICD-10-CM | POA: Diagnosis not present

## 2016-03-30 DIAGNOSIS — K219 Gastro-esophageal reflux disease without esophagitis: Secondary | ICD-10-CM | POA: Diagnosis not present

## 2016-03-30 DIAGNOSIS — R269 Unspecified abnormalities of gait and mobility: Secondary | ICD-10-CM | POA: Diagnosis not present

## 2016-03-30 DIAGNOSIS — R69 Illness, unspecified: Secondary | ICD-10-CM | POA: Diagnosis not present

## 2016-03-30 DIAGNOSIS — L03111 Cellulitis of right axilla: Secondary | ICD-10-CM | POA: Diagnosis not present

## 2016-03-30 DIAGNOSIS — M1990 Unspecified osteoarthritis, unspecified site: Secondary | ICD-10-CM | POA: Diagnosis not present

## 2016-03-31 DIAGNOSIS — R69 Illness, unspecified: Secondary | ICD-10-CM | POA: Diagnosis not present

## 2016-03-31 DIAGNOSIS — M1990 Unspecified osteoarthritis, unspecified site: Secondary | ICD-10-CM | POA: Diagnosis not present

## 2016-03-31 DIAGNOSIS — G309 Alzheimer's disease, unspecified: Secondary | ICD-10-CM | POA: Diagnosis not present

## 2016-03-31 DIAGNOSIS — L732 Hidradenitis suppurativa: Secondary | ICD-10-CM | POA: Diagnosis not present

## 2016-03-31 DIAGNOSIS — B9562 Methicillin resistant Staphylococcus aureus infection as the cause of diseases classified elsewhere: Secondary | ICD-10-CM | POA: Diagnosis not present

## 2016-03-31 DIAGNOSIS — L03111 Cellulitis of right axilla: Secondary | ICD-10-CM | POA: Diagnosis not present

## 2016-04-01 DIAGNOSIS — B9562 Methicillin resistant Staphylococcus aureus infection as the cause of diseases classified elsewhere: Secondary | ICD-10-CM | POA: Diagnosis not present

## 2016-04-01 DIAGNOSIS — L03111 Cellulitis of right axilla: Secondary | ICD-10-CM | POA: Diagnosis not present

## 2016-04-01 DIAGNOSIS — S00412A Abrasion of left ear, initial encounter: Secondary | ICD-10-CM | POA: Diagnosis not present

## 2016-04-01 DIAGNOSIS — S41109D Unspecified open wound of unspecified upper arm, subsequent encounter: Secondary | ICD-10-CM | POA: Diagnosis not present

## 2016-04-01 DIAGNOSIS — R262 Difficulty in walking, not elsewhere classified: Secondary | ICD-10-CM | POA: Diagnosis not present

## 2016-04-01 DIAGNOSIS — Z9189 Other specified personal risk factors, not elsewhere classified: Secondary | ICD-10-CM | POA: Diagnosis not present

## 2016-04-01 DIAGNOSIS — L732 Hidradenitis suppurativa: Secondary | ICD-10-CM | POA: Diagnosis not present

## 2016-04-01 DIAGNOSIS — M1990 Unspecified osteoarthritis, unspecified site: Secondary | ICD-10-CM | POA: Diagnosis not present

## 2016-04-01 DIAGNOSIS — G309 Alzheimer's disease, unspecified: Secondary | ICD-10-CM | POA: Diagnosis not present

## 2016-04-01 DIAGNOSIS — R69 Illness, unspecified: Secondary | ICD-10-CM | POA: Diagnosis not present

## 2016-04-05 DIAGNOSIS — R69 Illness, unspecified: Secondary | ICD-10-CM | POA: Diagnosis not present

## 2016-04-05 DIAGNOSIS — L03111 Cellulitis of right axilla: Secondary | ICD-10-CM | POA: Diagnosis not present

## 2016-04-05 DIAGNOSIS — L732 Hidradenitis suppurativa: Secondary | ICD-10-CM | POA: Diagnosis not present

## 2016-04-05 DIAGNOSIS — M1990 Unspecified osteoarthritis, unspecified site: Secondary | ICD-10-CM | POA: Diagnosis not present

## 2016-04-05 DIAGNOSIS — B9562 Methicillin resistant Staphylococcus aureus infection as the cause of diseases classified elsewhere: Secondary | ICD-10-CM | POA: Diagnosis not present

## 2016-04-05 DIAGNOSIS — G309 Alzheimer's disease, unspecified: Secondary | ICD-10-CM | POA: Diagnosis not present

## 2016-04-07 DIAGNOSIS — B9562 Methicillin resistant Staphylococcus aureus infection as the cause of diseases classified elsewhere: Secondary | ICD-10-CM | POA: Diagnosis not present

## 2016-04-07 DIAGNOSIS — R69 Illness, unspecified: Secondary | ICD-10-CM | POA: Diagnosis not present

## 2016-04-07 DIAGNOSIS — M1990 Unspecified osteoarthritis, unspecified site: Secondary | ICD-10-CM | POA: Diagnosis not present

## 2016-04-07 DIAGNOSIS — G309 Alzheimer's disease, unspecified: Secondary | ICD-10-CM | POA: Diagnosis not present

## 2016-04-07 DIAGNOSIS — L03111 Cellulitis of right axilla: Secondary | ICD-10-CM | POA: Diagnosis not present

## 2016-04-07 DIAGNOSIS — L732 Hidradenitis suppurativa: Secondary | ICD-10-CM | POA: Diagnosis not present

## 2016-04-08 DIAGNOSIS — R69 Illness, unspecified: Secondary | ICD-10-CM | POA: Diagnosis not present

## 2016-04-08 DIAGNOSIS — M1990 Unspecified osteoarthritis, unspecified site: Secondary | ICD-10-CM | POA: Diagnosis not present

## 2016-04-08 DIAGNOSIS — L732 Hidradenitis suppurativa: Secondary | ICD-10-CM | POA: Diagnosis not present

## 2016-04-08 DIAGNOSIS — G309 Alzheimer's disease, unspecified: Secondary | ICD-10-CM | POA: Diagnosis not present

## 2016-04-08 DIAGNOSIS — B9562 Methicillin resistant Staphylococcus aureus infection as the cause of diseases classified elsewhere: Secondary | ICD-10-CM | POA: Diagnosis not present

## 2016-04-08 DIAGNOSIS — L03111 Cellulitis of right axilla: Secondary | ICD-10-CM | POA: Diagnosis not present

## 2016-04-14 DIAGNOSIS — L732 Hidradenitis suppurativa: Secondary | ICD-10-CM | POA: Diagnosis not present

## 2016-04-14 DIAGNOSIS — M1990 Unspecified osteoarthritis, unspecified site: Secondary | ICD-10-CM | POA: Diagnosis not present

## 2016-04-14 DIAGNOSIS — L03111 Cellulitis of right axilla: Secondary | ICD-10-CM | POA: Diagnosis not present

## 2016-04-14 DIAGNOSIS — G309 Alzheimer's disease, unspecified: Secondary | ICD-10-CM | POA: Diagnosis not present

## 2016-04-14 DIAGNOSIS — B9562 Methicillin resistant Staphylococcus aureus infection as the cause of diseases classified elsewhere: Secondary | ICD-10-CM | POA: Diagnosis not present

## 2016-04-14 DIAGNOSIS — R69 Illness, unspecified: Secondary | ICD-10-CM | POA: Diagnosis not present

## 2016-04-16 DIAGNOSIS — L732 Hidradenitis suppurativa: Secondary | ICD-10-CM | POA: Diagnosis not present

## 2016-04-16 DIAGNOSIS — B9562 Methicillin resistant Staphylococcus aureus infection as the cause of diseases classified elsewhere: Secondary | ICD-10-CM | POA: Diagnosis not present

## 2016-04-16 DIAGNOSIS — L03111 Cellulitis of right axilla: Secondary | ICD-10-CM | POA: Diagnosis not present

## 2016-04-16 DIAGNOSIS — M1990 Unspecified osteoarthritis, unspecified site: Secondary | ICD-10-CM | POA: Diagnosis not present

## 2016-04-16 DIAGNOSIS — R69 Illness, unspecified: Secondary | ICD-10-CM | POA: Diagnosis not present

## 2016-04-16 DIAGNOSIS — G309 Alzheimer's disease, unspecified: Secondary | ICD-10-CM | POA: Diagnosis not present

## 2016-04-19 DIAGNOSIS — L03049 Acute lymphangitis of unspecified toe: Secondary | ICD-10-CM | POA: Diagnosis not present

## 2016-04-19 DIAGNOSIS — B351 Tinea unguium: Secondary | ICD-10-CM | POA: Diagnosis not present

## 2016-04-19 DIAGNOSIS — M79676 Pain in unspecified toe(s): Secondary | ICD-10-CM | POA: Diagnosis not present

## 2016-04-19 DIAGNOSIS — L600 Ingrowing nail: Secondary | ICD-10-CM | POA: Diagnosis not present

## 2016-04-20 ENCOUNTER — Ambulatory Visit: Payer: Medicare HMO | Admitting: Family Medicine

## 2016-04-21 DIAGNOSIS — Q659 Congenital deformity of hip, unspecified: Secondary | ICD-10-CM | POA: Diagnosis not present

## 2016-04-21 DIAGNOSIS — E785 Hyperlipidemia, unspecified: Secondary | ICD-10-CM | POA: Diagnosis not present

## 2016-04-21 DIAGNOSIS — Z96649 Presence of unspecified artificial hip joint: Secondary | ICD-10-CM | POA: Diagnosis not present

## 2016-04-21 DIAGNOSIS — L0390 Cellulitis, unspecified: Secondary | ICD-10-CM | POA: Diagnosis not present

## 2016-04-29 DIAGNOSIS — S41109A Unspecified open wound of unspecified upper arm, initial encounter: Secondary | ICD-10-CM | POA: Diagnosis not present

## 2016-04-29 DIAGNOSIS — Z9181 History of falling: Secondary | ICD-10-CM | POA: Diagnosis not present

## 2016-04-29 DIAGNOSIS — M545 Low back pain: Secondary | ICD-10-CM | POA: Diagnosis not present

## 2016-04-29 DIAGNOSIS — M1990 Unspecified osteoarthritis, unspecified site: Secondary | ICD-10-CM | POA: Diagnosis not present

## 2016-04-29 DIAGNOSIS — I10 Essential (primary) hypertension: Secondary | ICD-10-CM | POA: Diagnosis not present

## 2016-04-29 DIAGNOSIS — R262 Difficulty in walking, not elsewhere classified: Secondary | ICD-10-CM | POA: Diagnosis not present

## 2016-05-06 DIAGNOSIS — M545 Low back pain: Secondary | ICD-10-CM | POA: Diagnosis not present

## 2016-05-06 DIAGNOSIS — M25521 Pain in right elbow: Secondary | ICD-10-CM | POA: Diagnosis not present

## 2016-05-06 DIAGNOSIS — I10 Essential (primary) hypertension: Secondary | ICD-10-CM | POA: Diagnosis not present

## 2016-05-06 DIAGNOSIS — R262 Difficulty in walking, not elsewhere classified: Secondary | ICD-10-CM | POA: Diagnosis not present

## 2016-05-06 DIAGNOSIS — Z9189 Other specified personal risk factors, not elsewhere classified: Secondary | ICD-10-CM | POA: Diagnosis not present

## 2016-05-06 DIAGNOSIS — M1990 Unspecified osteoarthritis, unspecified site: Secondary | ICD-10-CM | POA: Diagnosis not present

## 2016-05-22 DIAGNOSIS — Z96649 Presence of unspecified artificial hip joint: Secondary | ICD-10-CM | POA: Diagnosis not present

## 2016-05-22 DIAGNOSIS — E785 Hyperlipidemia, unspecified: Secondary | ICD-10-CM | POA: Diagnosis not present

## 2016-05-22 DIAGNOSIS — Q659 Congenital deformity of hip, unspecified: Secondary | ICD-10-CM | POA: Diagnosis not present

## 2016-05-22 DIAGNOSIS — L0390 Cellulitis, unspecified: Secondary | ICD-10-CM | POA: Diagnosis not present

## 2016-06-07 DIAGNOSIS — M25521 Pain in right elbow: Secondary | ICD-10-CM | POA: Diagnosis not present

## 2016-06-07 DIAGNOSIS — R262 Difficulty in walking, not elsewhere classified: Secondary | ICD-10-CM | POA: Diagnosis not present

## 2016-06-07 DIAGNOSIS — G8929 Other chronic pain: Secondary | ICD-10-CM | POA: Diagnosis not present

## 2016-06-07 DIAGNOSIS — I10 Essential (primary) hypertension: Secondary | ICD-10-CM | POA: Diagnosis not present

## 2016-06-07 DIAGNOSIS — M1990 Unspecified osteoarthritis, unspecified site: Secondary | ICD-10-CM | POA: Diagnosis not present

## 2016-06-07 DIAGNOSIS — R69 Illness, unspecified: Secondary | ICD-10-CM | POA: Diagnosis not present

## 2016-06-07 DIAGNOSIS — Z9181 History of falling: Secondary | ICD-10-CM | POA: Diagnosis not present

## 2016-06-11 DIAGNOSIS — M19021 Primary osteoarthritis, right elbow: Secondary | ICD-10-CM | POA: Diagnosis not present

## 2016-06-11 DIAGNOSIS — I10 Essential (primary) hypertension: Secondary | ICD-10-CM | POA: Diagnosis not present

## 2016-06-11 DIAGNOSIS — G3184 Mild cognitive impairment, so stated: Secondary | ICD-10-CM | POA: Diagnosis not present

## 2016-06-11 DIAGNOSIS — R269 Unspecified abnormalities of gait and mobility: Secondary | ICD-10-CM | POA: Diagnosis not present

## 2016-06-11 DIAGNOSIS — R2689 Other abnormalities of gait and mobility: Secondary | ICD-10-CM | POA: Diagnosis not present

## 2016-06-11 DIAGNOSIS — R296 Repeated falls: Secondary | ICD-10-CM | POA: Diagnosis not present

## 2016-06-11 DIAGNOSIS — R531 Weakness: Secondary | ICD-10-CM | POA: Diagnosis not present

## 2016-06-11 DIAGNOSIS — G8929 Other chronic pain: Secondary | ICD-10-CM | POA: Diagnosis not present

## 2016-06-11 DIAGNOSIS — M545 Low back pain: Secondary | ICD-10-CM | POA: Diagnosis not present

## 2016-06-16 DIAGNOSIS — R531 Weakness: Secondary | ICD-10-CM | POA: Diagnosis not present

## 2016-06-16 DIAGNOSIS — I10 Essential (primary) hypertension: Secondary | ICD-10-CM | POA: Diagnosis not present

## 2016-06-16 DIAGNOSIS — R269 Unspecified abnormalities of gait and mobility: Secondary | ICD-10-CM | POA: Diagnosis not present

## 2016-06-16 DIAGNOSIS — G8929 Other chronic pain: Secondary | ICD-10-CM | POA: Diagnosis not present

## 2016-06-16 DIAGNOSIS — R296 Repeated falls: Secondary | ICD-10-CM | POA: Diagnosis not present

## 2016-06-16 DIAGNOSIS — M19021 Primary osteoarthritis, right elbow: Secondary | ICD-10-CM | POA: Diagnosis not present

## 2016-06-17 DIAGNOSIS — R296 Repeated falls: Secondary | ICD-10-CM | POA: Diagnosis not present

## 2016-06-17 DIAGNOSIS — G8929 Other chronic pain: Secondary | ICD-10-CM | POA: Diagnosis not present

## 2016-06-17 DIAGNOSIS — I10 Essential (primary) hypertension: Secondary | ICD-10-CM | POA: Diagnosis not present

## 2016-06-17 DIAGNOSIS — M19021 Primary osteoarthritis, right elbow: Secondary | ICD-10-CM | POA: Diagnosis not present

## 2016-06-17 DIAGNOSIS — R269 Unspecified abnormalities of gait and mobility: Secondary | ICD-10-CM | POA: Diagnosis not present

## 2016-06-17 DIAGNOSIS — R531 Weakness: Secondary | ICD-10-CM | POA: Diagnosis not present

## 2016-06-18 ENCOUNTER — Emergency Department: Payer: Medicare HMO

## 2016-06-18 ENCOUNTER — Encounter: Payer: Self-pay | Admitting: Emergency Medicine

## 2016-06-18 ENCOUNTER — Emergency Department
Admission: EM | Admit: 2016-06-18 | Discharge: 2016-06-18 | Disposition: A | Discharge status: Home / Self Care | Payer: Medicare HMO | Attending: Emergency Medicine | Admitting: Emergency Medicine

## 2016-06-18 DIAGNOSIS — N189 Chronic kidney disease, unspecified: Secondary | ICD-10-CM | POA: Insufficient documentation

## 2016-06-18 DIAGNOSIS — E785 Hyperlipidemia, unspecified: Secondary | ICD-10-CM | POA: Insufficient documentation

## 2016-06-18 DIAGNOSIS — M1990 Unspecified osteoarthritis, unspecified site: Secondary | ICD-10-CM | POA: Diagnosis not present

## 2016-06-18 DIAGNOSIS — Y9289 Other specified places as the place of occurrence of the external cause: Secondary | ICD-10-CM | POA: Diagnosis not present

## 2016-06-18 DIAGNOSIS — S43005A Unspecified dislocation of left shoulder joint, initial encounter: Secondary | ICD-10-CM | POA: Diagnosis not present

## 2016-06-18 DIAGNOSIS — Y939 Activity, unspecified: Secondary | ICD-10-CM | POA: Insufficient documentation

## 2016-06-18 DIAGNOSIS — Y999 Unspecified external cause status: Secondary | ICD-10-CM | POA: Diagnosis not present

## 2016-06-18 DIAGNOSIS — W1839XA Other fall on same level, initial encounter: Secondary | ICD-10-CM | POA: Diagnosis not present

## 2016-06-18 DIAGNOSIS — S43035A Inferior dislocation of left humerus, initial encounter: Secondary | ICD-10-CM | POA: Diagnosis not present

## 2016-06-18 DIAGNOSIS — I129 Hypertensive chronic kidney disease with stage 1 through stage 4 chronic kidney disease, or unspecified chronic kidney disease: Secondary | ICD-10-CM | POA: Diagnosis not present

## 2016-06-18 DIAGNOSIS — G309 Alzheimer's disease, unspecified: Secondary | ICD-10-CM | POA: Diagnosis not present

## 2016-06-18 DIAGNOSIS — M79602 Pain in left arm: Secondary | ICD-10-CM | POA: Diagnosis not present

## 2016-06-18 DIAGNOSIS — S43015A Anterior dislocation of left humerus, initial encounter: Secondary | ICD-10-CM | POA: Diagnosis not present

## 2016-06-18 DIAGNOSIS — W19XXXA Unspecified fall, initial encounter: Secondary | ICD-10-CM | POA: Diagnosis not present

## 2016-06-18 MED ORDER — FENTANYL CITRATE (PF) 100 MCG/2ML IJ SOLN
100.0000 ug | Freq: Once | INTRAMUSCULAR | Status: AC
  Administered 2016-06-18: 100 ug via INTRAVENOUS
  Filled 2016-06-18: qty 2

## 2016-06-18 MED ORDER — MIDAZOLAM HCL 2 MG/2ML IJ SOLN
2.0000 mg | Freq: Once | INTRAMUSCULAR | Status: AC
  Administered 2016-06-18: 2 mg via INTRAVENOUS
  Filled 2016-06-18: qty 2

## 2016-06-18 MED ORDER — MORPHINE SULFATE (PF) 4 MG/ML IV SOLN
4.0000 mg | Freq: Once | INTRAVENOUS | Status: AC
  Administered 2016-06-18: 4 mg via INTRAVENOUS
  Filled 2016-06-18: qty 1

## 2016-06-18 NOTE — Sedation Documentation (Signed)
Pt alert and talking at this time, stating "pain free" at this time. Vitals stable, NAD, will continue to monitor.

## 2016-06-18 NOTE — ED Provider Notes (Signed)
Time Seen: Approximately *1820  I have reviewed the triage notes  Chief Complaint: Fall and Arm Pain   History of Present Illness: Caroline Wilkerson is a 82 y.o. female who had a witnessed non-syncopal fall patient injured her left arm with an obvious deformity to the left shoulder. Patient did not strike her head and has no other physical complaints such as neck pain chest pain abdominal discomfort or hip discomfort. She has a history of Alzheimer's dementia though she can answer most questions appropriately. Past Medical History  Diagnosis Date  . Chronic kidney disease   . Vertigo   . Hypertension   . Hyperlipidemia   . Arthritis   . Alzheimer's dementia   . Cardiomegaly   . Hearing loss   . Fibrocystic breast     Patient Active Problem List   Diagnosis Date Noted  . Cellulitis 02/17/2016  . Generalized weakness 02/17/2016  . Decubitus ulcer of buttock, stage 1 10/21/2015  . Hypertension   . Hyperlipidemia   . Alzheimer's dementia     Past Surgical History  Procedure Laterality Date  . Abdominal hysterectomy    . Cholecystectomy    . Breast surgery    . Rotator cuff surgery Bilateral   . Carpal tunnel release    . Cataract extraction Bilateral   . Joint replacement Right     knee and hip    Past Surgical History  Procedure Laterality Date  . Abdominal hysterectomy    . Cholecystectomy    . Breast surgery    . Rotator cuff surgery Bilateral   . Carpal tunnel release    . Cataract extraction Bilateral   . Joint replacement Right     knee and hip    Current Outpatient Rx  Name  Route  Sig  Dispense  Refill  . acetaminophen (TYLENOL) 500 MG tablet   Oral   Take 500 mg by mouth every 6 (six) hours as needed for moderate pain.          . calcium carbonate (OS-CAL) 600 MG TABS tablet   Oral   Take 600 mg by mouth 2 (two) times daily with a meal.         . clindamycin (CLEOCIN) 300 MG capsule   Oral   Take 1 capsule (300 mg total) by mouth every 6  (six) hours.   21 capsule   0   . meloxicam (MOBIC) 7.5 MG tablet   Oral   Take 7.5 mg by mouth 2 (two) times daily.         . mirabegron ER (MYRBETRIQ) 25 MG TB24 tablet   Oral   Take 1 tablet (25 mg total) by mouth daily.   30 tablet   6   . mirtazapine (REMERON SOL-TAB) 15 MG disintegrating tablet   Oral   Take 1 tablet (15 mg total) by mouth at bedtime as needed (use for sleep and combatave behaviour).   30 tablet   2   . omeprazole (PRILOSEC) 20 MG capsule   Oral   Take 20 mg by mouth daily.           Allergies:  Codeine  Family History: Family History  Problem Relation Age of Onset  . Hypertension Daughter   . Hypertension Son   . Diabetes Son     Social History: Social History  Substance Use Topics  . Smoking status: Never Smoker   . Smokeless tobacco: Never Used  . Alcohol Use: No  Review of Systems:   10 point review of systems was performed and was otherwise negative:  Constitutional: No fever Eyes: No visual disturbances ENT: No sore throat, ear pain Cardiac: No chest pain Respiratory: No shortness of breath, wheezing, or stridor Abdomen: No abdominal pain, no vomiting, No diarrhea Endocrine: No weight loss, No night sweats Extremities: No peripheral edema, cyanosis Skin: No rashes, easy bruising Neurologic: No focal weakness, trouble with speech or swollowing Urologic: No dysuria, Hematuria, or urinary frequency No thoracic or lumbar spine pain  Physical Exam:  ED Triage Vitals  Enc Vitals Group     BP --      Pulse --      Resp --      Temp --      Temp src --      SpO2 --      Weight --      Height --      Head Cir --      Peak Flow --      Pain Score 06/18/16 1820 9     Pain Loc --      Pain Edu? --      Excl. in GC? --     General: Awake , Alert , and Oriented times 3; GCS 15 Head: Normal cephalic , atraumatic Eyes: Pupils equal , round, reactive to light Nose/Throat: No nasal drainage, patent upper airway  without erythema or exudate.  Neck: Supple, Full range of motion, No anterior adenopathy or palpable thyroid masses Lungs: Clear to ascultation without wheezes , rhonchi, or rales Heart: Regular rate, regular rhythm without murmurs , gallops , or rubs Abdomen: Soft, non tender without rebound, guarding , or rigidity; bowel sounds positive and symmetric in all 4 quadrants. No organomegaly .        Extremities:Obvious deformity at the left shoulder with the left upper extremity being neurovascularly intact. No open wound is noted. Neurologic: normal ambulation, Motor symmetric without deficits, sensory intact Skin: warm, dry, no rashes     Radiology:        DG Shoulder Left Port (Final result) Result time: 06/18/16 20:01:56   Final result by Rad Results In Interface (06/18/16 20:01:56)   Narrative:   CLINICAL DATA: Post reduction  EXAM: LEFT SHOULDER - 1 VIEW  COMPARISON: 06/18/2016 at 1820 hours  FINDINGS: Reduction anterior shoulder dislocation. No evidence of fracture. The humeral head rides high in the glenohumeral fossa suggesting rotator cuff insufficiency  IMPRESSION: Reduction of anterior LEFT shoulder dislocation. No fracture evident.   Electronically Signed By: Genevive Bi M.D. On: 06/18/2016 20:01          DG Shoulder Left (Final result) Result time: 06/18/16 18:44:18   Final result by Rad Results In Interface (06/18/16 18:44:18)   Narrative:   CLINICAL DATA: Pain and deformity  EXAM: LEFT SHOULDER - 2+ VIEW  COMPARISON: None.  FINDINGS: Complete anterior inferior dislocation of the humeral head relative to the bony glenoid. No visible fracture.  IMPRESSION: Anterior inferior dislocation.   Electronically Signed By: Paulina Fusi M.D. On: 06/18/2016 18:44        ECG Results         I personally reviewed the radiologic studies   Procedures:  Patient received a traction counter traction reduction of the  left shoulder  Patient received conscious sedation for reduction of a left shoulder anterior inferior dislocation. Patient had IV access obtained and was placed on a continuous cardiac monitor and pulse oximetry. Consent was obtained through  the patient's family and I felt she was a good candidate for conscious sedation for reduction. Her airway was assessed and her medical history reviewed. I injected Fentanyl 100 g and Versed 2 mg IV. Patient received adequate sedation had no significant episodes of hypoxia or respiratory issues and remained with a normal blood pressure. Bedside one-to-one conscious sedation time was 21 minutes  Time out was obtained and I reduced the left shoulder.    ED Course:  Patient tolerated reduction well and there does not appear to be any associated fractures etc. Patient will be discharged back to the facility. Older immobilizer was applied by the nursing staff which patient tolerated well and I felt she should stay in the immobilizer for 24 hours to allow some healing to occur.*    Assessment: * Left shoulder dislocation Conscious sedation Shoulder reduction      Plan:  Outpatient management Patient was advised to return immediately if condition worsens. Patient was advised to follow up with their primary care physician or other specialized physicians involved in their outpatient care. The patient and/or family member/power of attorney had laboratory results reviewed at the bedside. All questions and concerns were addressed and appropriate discharge instructions were distributed by the nursing staff.           Jennye MoccasinBrian S Quigley, MD 06/18/16 2053

## 2016-06-18 NOTE — Discharge Instructions (Signed)
Shoulder Dislocation Your shoulder joint is made up of 3 bones:  The upper arm bone (humerus).  The shoulder blade (scapula).  The collarbone (clavicle). A shoulder dislocation happens when your upper arm bone moves out of its normal place in your shoulder joint. HOME CARE If You Have a Splint or Sling:  Wear it as told by your doctor.  Take it off only as told by your doctor.  Loosen it if:  Your fingers become numb and tingly.  Your fingers turn cold and blue.  Keep it clean and dry. Bathing  Do not take baths, swim, or use a hot tub until your doctor says you can. Ask your doctor if you can take showers. You may only be allowed to take sponge baths.  If your doctor says taking baths or showers is okay, cover your splint or sling with a plastic bag. Do not let the splint or sling get wet. Managing Pain, Stiffness, and Swelling  If told, put ice on the injured area.  Put ice in a plastic bag.  Place a towel between your skin and the bag.  Leave the ice on for 20 minutes, 2-3 times per day.  Move your fingers often to avoid stiffness and to lessen swelling.  Raise (elevate) the injured area above the level of your heart while you are sitting or lying down. Driving  Do not drive while you are wearing a splint or sling on a hand that you use for driving.  Do not drive or operate heavy machinery while taking pain medicine. Activity  Return to your normal activities as told by your doctor. Ask your doctor what activities are safe for you.  Do range-of-motion exercises only as told by your doctor.  Exercise your hand by squeezing a soft ball. This keeps your hand and wrist from getting stiff and swollen. General Instructions  Take over-the-counter and prescription medicines only as told by your doctor.  Do not use any tobacco products, including cigarettes, chewing tobacco, or e-cigarettes. Tobacco can slow down healing. If you need help quitting, ask your  doctor.  Keep all follow-up visits as told by your doctor. This is important. GET HELP IF:  Your splint or sling gets damaged. GET HELP RIGHT AWAY IF:  Your pain gets worse instead of better.  You lose feeling in your arm or hand.  Your arm or hand turns white and cold.   This information is not intended to replace advice given to you by your health care provider. Make sure you discuss any questions you have with your health care provider.   Document Released: 02/21/2012 Document Revised: 08/20/2015 Document Reviewed: 03/24/2015 Elsevier Interactive Patient Education [employer]2016 [employer]  Please see the patient in the sling for 24 hours and then can remove the sling and put the patient through some range of motion if the patient's feeling improved and asymptomatic then she can continue to be out of the sling. Patient can receive routine follow-up by her primary physician at the facility. Continue current medications.

## 2016-06-18 NOTE — Sedation Documentation (Signed)
Reduction successful at this time, pt stable. RN and MD at bedside awaiting for Xray to arrive.

## 2016-06-18 NOTE — Sedation Documentation (Signed)
X-ray at bedside at this time.

## 2016-06-18 NOTE — ED Notes (Signed)
Pt arrived by EMS after a witnessed fall at the [facility] where she is resident. Pt fell forward and is now has c/o of upper arm pain. There is an obvious deformity of the shoulder.

## 2016-06-18 NOTE — Sedation Documentation (Signed)
Pt drowsy but responsive to voice at this time, eyes opening on command, vitals stable. NAD noted.

## 2016-06-21 DIAGNOSIS — L600 Ingrowing nail: Secondary | ICD-10-CM | POA: Diagnosis not present

## 2016-06-21 DIAGNOSIS — L0390 Cellulitis, unspecified: Secondary | ICD-10-CM | POA: Diagnosis not present

## 2016-06-21 DIAGNOSIS — M79676 Pain in unspecified toe(s): Secondary | ICD-10-CM | POA: Diagnosis not present

## 2016-06-21 DIAGNOSIS — B351 Tinea unguium: Secondary | ICD-10-CM | POA: Diagnosis not present

## 2016-06-21 DIAGNOSIS — E785 Hyperlipidemia, unspecified: Secondary | ICD-10-CM | POA: Diagnosis not present

## 2016-06-21 DIAGNOSIS — Q659 Congenital deformity of hip, unspecified: Secondary | ICD-10-CM | POA: Diagnosis not present

## 2016-06-21 DIAGNOSIS — Z96649 Presence of unspecified artificial hip joint: Secondary | ICD-10-CM | POA: Diagnosis not present

## 2016-06-22 DIAGNOSIS — R269 Unspecified abnormalities of gait and mobility: Secondary | ICD-10-CM | POA: Diagnosis not present

## 2016-06-22 DIAGNOSIS — G8929 Other chronic pain: Secondary | ICD-10-CM | POA: Diagnosis not present

## 2016-06-22 DIAGNOSIS — M19021 Primary osteoarthritis, right elbow: Secondary | ICD-10-CM | POA: Diagnosis not present

## 2016-06-22 DIAGNOSIS — R296 Repeated falls: Secondary | ICD-10-CM | POA: Diagnosis not present

## 2016-06-22 DIAGNOSIS — R531 Weakness: Secondary | ICD-10-CM | POA: Diagnosis not present

## 2016-06-22 DIAGNOSIS — I10 Essential (primary) hypertension: Secondary | ICD-10-CM | POA: Diagnosis not present

## 2016-06-23 DIAGNOSIS — M19021 Primary osteoarthritis, right elbow: Secondary | ICD-10-CM | POA: Diagnosis not present

## 2016-06-23 DIAGNOSIS — I10 Essential (primary) hypertension: Secondary | ICD-10-CM | POA: Diagnosis not present

## 2016-06-23 DIAGNOSIS — R269 Unspecified abnormalities of gait and mobility: Secondary | ICD-10-CM | POA: Diagnosis not present

## 2016-06-23 DIAGNOSIS — G8929 Other chronic pain: Secondary | ICD-10-CM | POA: Diagnosis not present

## 2016-06-23 DIAGNOSIS — R531 Weakness: Secondary | ICD-10-CM | POA: Diagnosis not present

## 2016-06-23 DIAGNOSIS — R296 Repeated falls: Secondary | ICD-10-CM | POA: Diagnosis not present

## 2016-06-24 DIAGNOSIS — G8929 Other chronic pain: Secondary | ICD-10-CM | POA: Diagnosis not present

## 2016-06-24 DIAGNOSIS — I10 Essential (primary) hypertension: Secondary | ICD-10-CM | POA: Diagnosis not present

## 2016-06-24 DIAGNOSIS — R69 Illness, unspecified: Secondary | ICD-10-CM | POA: Diagnosis not present

## 2016-06-24 DIAGNOSIS — M1990 Unspecified osteoarthritis, unspecified site: Secondary | ICD-10-CM | POA: Diagnosis not present

## 2016-06-24 DIAGNOSIS — Z9181 History of falling: Secondary | ICD-10-CM | POA: Diagnosis not present

## 2016-06-24 DIAGNOSIS — R262 Difficulty in walking, not elsewhere classified: Secondary | ICD-10-CM | POA: Diagnosis not present

## 2016-06-28 DIAGNOSIS — R2681 Unsteadiness on feet: Secondary | ICD-10-CM | POA: Diagnosis not present

## 2016-06-28 DIAGNOSIS — L600 Ingrowing nail: Secondary | ICD-10-CM | POA: Diagnosis not present

## 2016-06-28 DIAGNOSIS — M797 Fibromyalgia: Secondary | ICD-10-CM | POA: Diagnosis not present

## 2016-06-28 DIAGNOSIS — B351 Tinea unguium: Secondary | ICD-10-CM | POA: Diagnosis not present

## 2016-06-29 DIAGNOSIS — R269 Unspecified abnormalities of gait and mobility: Secondary | ICD-10-CM | POA: Diagnosis not present

## 2016-06-29 DIAGNOSIS — M19021 Primary osteoarthritis, right elbow: Secondary | ICD-10-CM | POA: Diagnosis not present

## 2016-06-29 DIAGNOSIS — R296 Repeated falls: Secondary | ICD-10-CM | POA: Diagnosis not present

## 2016-06-29 DIAGNOSIS — I10 Essential (primary) hypertension: Secondary | ICD-10-CM | POA: Diagnosis not present

## 2016-06-29 DIAGNOSIS — G8929 Other chronic pain: Secondary | ICD-10-CM | POA: Diagnosis not present

## 2016-06-29 DIAGNOSIS — R531 Weakness: Secondary | ICD-10-CM | POA: Diagnosis not present

## 2016-07-01 DIAGNOSIS — R296 Repeated falls: Secondary | ICD-10-CM | POA: Diagnosis not present

## 2016-07-01 DIAGNOSIS — M19021 Primary osteoarthritis, right elbow: Secondary | ICD-10-CM | POA: Diagnosis not present

## 2016-07-01 DIAGNOSIS — G8929 Other chronic pain: Secondary | ICD-10-CM | POA: Diagnosis not present

## 2016-07-01 DIAGNOSIS — R269 Unspecified abnormalities of gait and mobility: Secondary | ICD-10-CM | POA: Diagnosis not present

## 2016-07-01 DIAGNOSIS — I10 Essential (primary) hypertension: Secondary | ICD-10-CM | POA: Diagnosis not present

## 2016-07-01 DIAGNOSIS — R531 Weakness: Secondary | ICD-10-CM | POA: Diagnosis not present

## 2016-07-02 DIAGNOSIS — R531 Weakness: Secondary | ICD-10-CM | POA: Diagnosis not present

## 2016-07-02 DIAGNOSIS — R269 Unspecified abnormalities of gait and mobility: Secondary | ICD-10-CM | POA: Diagnosis not present

## 2016-07-02 DIAGNOSIS — G8929 Other chronic pain: Secondary | ICD-10-CM | POA: Diagnosis not present

## 2016-07-02 DIAGNOSIS — M19021 Primary osteoarthritis, right elbow: Secondary | ICD-10-CM | POA: Diagnosis not present

## 2016-07-02 DIAGNOSIS — I10 Essential (primary) hypertension: Secondary | ICD-10-CM | POA: Diagnosis not present

## 2016-07-02 DIAGNOSIS — R296 Repeated falls: Secondary | ICD-10-CM | POA: Diagnosis not present

## 2016-07-04 DIAGNOSIS — I10 Essential (primary) hypertension: Secondary | ICD-10-CM | POA: Diagnosis not present

## 2016-07-04 DIAGNOSIS — M19021 Primary osteoarthritis, right elbow: Secondary | ICD-10-CM | POA: Diagnosis not present

## 2016-07-04 DIAGNOSIS — R269 Unspecified abnormalities of gait and mobility: Secondary | ICD-10-CM | POA: Diagnosis not present

## 2016-07-04 DIAGNOSIS — G8929 Other chronic pain: Secondary | ICD-10-CM | POA: Diagnosis not present

## 2016-07-04 DIAGNOSIS — R296 Repeated falls: Secondary | ICD-10-CM | POA: Diagnosis not present

## 2016-07-04 DIAGNOSIS — R531 Weakness: Secondary | ICD-10-CM | POA: Diagnosis not present

## 2016-07-06 DIAGNOSIS — Z79899 Other long term (current) drug therapy: Secondary | ICD-10-CM | POA: Diagnosis not present

## 2016-07-06 DIAGNOSIS — R531 Weakness: Secondary | ICD-10-CM | POA: Diagnosis not present

## 2016-07-06 DIAGNOSIS — R269 Unspecified abnormalities of gait and mobility: Secondary | ICD-10-CM | POA: Diagnosis not present

## 2016-07-06 DIAGNOSIS — R296 Repeated falls: Secondary | ICD-10-CM | POA: Diagnosis not present

## 2016-07-06 DIAGNOSIS — E119 Type 2 diabetes mellitus without complications: Secondary | ICD-10-CM | POA: Diagnosis not present

## 2016-07-06 DIAGNOSIS — M19021 Primary osteoarthritis, right elbow: Secondary | ICD-10-CM | POA: Diagnosis not present

## 2016-07-06 DIAGNOSIS — I10 Essential (primary) hypertension: Secondary | ICD-10-CM | POA: Diagnosis not present

## 2016-07-06 DIAGNOSIS — G8929 Other chronic pain: Secondary | ICD-10-CM | POA: Diagnosis not present

## 2016-07-09 DIAGNOSIS — R269 Unspecified abnormalities of gait and mobility: Secondary | ICD-10-CM | POA: Diagnosis not present

## 2016-07-09 DIAGNOSIS — R296 Repeated falls: Secondary | ICD-10-CM | POA: Diagnosis not present

## 2016-07-09 DIAGNOSIS — G8929 Other chronic pain: Secondary | ICD-10-CM | POA: Diagnosis not present

## 2016-07-09 DIAGNOSIS — M19021 Primary osteoarthritis, right elbow: Secondary | ICD-10-CM | POA: Diagnosis not present

## 2016-07-09 DIAGNOSIS — R531 Weakness: Secondary | ICD-10-CM | POA: Diagnosis not present

## 2016-07-09 DIAGNOSIS — I10 Essential (primary) hypertension: Secondary | ICD-10-CM | POA: Diagnosis not present

## 2016-07-12 DIAGNOSIS — R296 Repeated falls: Secondary | ICD-10-CM | POA: Diagnosis not present

## 2016-07-12 DIAGNOSIS — R269 Unspecified abnormalities of gait and mobility: Secondary | ICD-10-CM | POA: Diagnosis not present

## 2016-07-12 DIAGNOSIS — M19021 Primary osteoarthritis, right elbow: Secondary | ICD-10-CM | POA: Diagnosis not present

## 2016-07-12 DIAGNOSIS — I10 Essential (primary) hypertension: Secondary | ICD-10-CM | POA: Diagnosis not present

## 2016-07-12 DIAGNOSIS — G8929 Other chronic pain: Secondary | ICD-10-CM | POA: Diagnosis not present

## 2016-07-12 DIAGNOSIS — R531 Weakness: Secondary | ICD-10-CM | POA: Diagnosis not present

## 2016-07-15 DIAGNOSIS — R296 Repeated falls: Secondary | ICD-10-CM | POA: Diagnosis not present

## 2016-07-15 DIAGNOSIS — R269 Unspecified abnormalities of gait and mobility: Secondary | ICD-10-CM | POA: Diagnosis not present

## 2016-07-15 DIAGNOSIS — I10 Essential (primary) hypertension: Secondary | ICD-10-CM | POA: Diagnosis not present

## 2016-07-15 DIAGNOSIS — R531 Weakness: Secondary | ICD-10-CM | POA: Diagnosis not present

## 2016-07-15 DIAGNOSIS — G8929 Other chronic pain: Secondary | ICD-10-CM | POA: Diagnosis not present

## 2016-07-15 DIAGNOSIS — M19021 Primary osteoarthritis, right elbow: Secondary | ICD-10-CM | POA: Diagnosis not present

## 2016-07-16 DIAGNOSIS — M19021 Primary osteoarthritis, right elbow: Secondary | ICD-10-CM | POA: Diagnosis not present

## 2016-07-16 DIAGNOSIS — R296 Repeated falls: Secondary | ICD-10-CM | POA: Diagnosis not present

## 2016-07-16 DIAGNOSIS — R531 Weakness: Secondary | ICD-10-CM | POA: Diagnosis not present

## 2016-07-16 DIAGNOSIS — R269 Unspecified abnormalities of gait and mobility: Secondary | ICD-10-CM | POA: Diagnosis not present

## 2016-07-16 DIAGNOSIS — I10 Essential (primary) hypertension: Secondary | ICD-10-CM | POA: Diagnosis not present

## 2016-07-16 DIAGNOSIS — G8929 Other chronic pain: Secondary | ICD-10-CM | POA: Diagnosis not present

## 2016-07-19 DIAGNOSIS — R269 Unspecified abnormalities of gait and mobility: Secondary | ICD-10-CM | POA: Diagnosis not present

## 2016-07-19 DIAGNOSIS — R296 Repeated falls: Secondary | ICD-10-CM | POA: Diagnosis not present

## 2016-07-19 DIAGNOSIS — M19021 Primary osteoarthritis, right elbow: Secondary | ICD-10-CM | POA: Diagnosis not present

## 2016-07-19 DIAGNOSIS — R531 Weakness: Secondary | ICD-10-CM | POA: Diagnosis not present

## 2016-07-19 DIAGNOSIS — I10 Essential (primary) hypertension: Secondary | ICD-10-CM | POA: Diagnosis not present

## 2016-07-19 DIAGNOSIS — G8929 Other chronic pain: Secondary | ICD-10-CM | POA: Diagnosis not present

## 2016-07-20 DIAGNOSIS — M19021 Primary osteoarthritis, right elbow: Secondary | ICD-10-CM | POA: Diagnosis not present

## 2016-07-20 DIAGNOSIS — G8929 Other chronic pain: Secondary | ICD-10-CM | POA: Diagnosis not present

## 2016-07-20 DIAGNOSIS — R269 Unspecified abnormalities of gait and mobility: Secondary | ICD-10-CM | POA: Diagnosis not present

## 2016-07-20 DIAGNOSIS — I10 Essential (primary) hypertension: Secondary | ICD-10-CM | POA: Diagnosis not present

## 2016-07-20 DIAGNOSIS — R296 Repeated falls: Secondary | ICD-10-CM | POA: Diagnosis not present

## 2016-07-20 DIAGNOSIS — R531 Weakness: Secondary | ICD-10-CM | POA: Diagnosis not present

## 2016-07-22 DIAGNOSIS — R531 Weakness: Secondary | ICD-10-CM | POA: Diagnosis not present

## 2016-07-22 DIAGNOSIS — E785 Hyperlipidemia, unspecified: Secondary | ICD-10-CM | POA: Diagnosis not present

## 2016-07-22 DIAGNOSIS — L0390 Cellulitis, unspecified: Secondary | ICD-10-CM | POA: Diagnosis not present

## 2016-07-22 DIAGNOSIS — G8929 Other chronic pain: Secondary | ICD-10-CM | POA: Diagnosis not present

## 2016-07-22 DIAGNOSIS — R296 Repeated falls: Secondary | ICD-10-CM | POA: Diagnosis not present

## 2016-07-22 DIAGNOSIS — Z96649 Presence of unspecified artificial hip joint: Secondary | ICD-10-CM | POA: Diagnosis not present

## 2016-07-22 DIAGNOSIS — M19021 Primary osteoarthritis, right elbow: Secondary | ICD-10-CM | POA: Diagnosis not present

## 2016-07-22 DIAGNOSIS — Q659 Congenital deformity of hip, unspecified: Secondary | ICD-10-CM | POA: Diagnosis not present

## 2016-07-22 DIAGNOSIS — I10 Essential (primary) hypertension: Secondary | ICD-10-CM | POA: Diagnosis not present

## 2016-07-22 DIAGNOSIS — R269 Unspecified abnormalities of gait and mobility: Secondary | ICD-10-CM | POA: Diagnosis not present

## 2016-07-23 DIAGNOSIS — R269 Unspecified abnormalities of gait and mobility: Secondary | ICD-10-CM | POA: Diagnosis not present

## 2016-07-23 DIAGNOSIS — I10 Essential (primary) hypertension: Secondary | ICD-10-CM | POA: Diagnosis not present

## 2016-07-23 DIAGNOSIS — R531 Weakness: Secondary | ICD-10-CM | POA: Diagnosis not present

## 2016-07-23 DIAGNOSIS — G8929 Other chronic pain: Secondary | ICD-10-CM | POA: Diagnosis not present

## 2016-07-23 DIAGNOSIS — R296 Repeated falls: Secondary | ICD-10-CM | POA: Diagnosis not present

## 2016-07-23 DIAGNOSIS — M19021 Primary osteoarthritis, right elbow: Secondary | ICD-10-CM | POA: Diagnosis not present

## 2016-07-26 DIAGNOSIS — M19021 Primary osteoarthritis, right elbow: Secondary | ICD-10-CM | POA: Diagnosis not present

## 2016-07-26 DIAGNOSIS — R296 Repeated falls: Secondary | ICD-10-CM | POA: Diagnosis not present

## 2016-07-26 DIAGNOSIS — R531 Weakness: Secondary | ICD-10-CM | POA: Diagnosis not present

## 2016-07-26 DIAGNOSIS — R269 Unspecified abnormalities of gait and mobility: Secondary | ICD-10-CM | POA: Diagnosis not present

## 2016-07-26 DIAGNOSIS — I10 Essential (primary) hypertension: Secondary | ICD-10-CM | POA: Diagnosis not present

## 2016-07-26 DIAGNOSIS — G8929 Other chronic pain: Secondary | ICD-10-CM | POA: Diagnosis not present

## 2016-07-28 DIAGNOSIS — I10 Essential (primary) hypertension: Secondary | ICD-10-CM | POA: Diagnosis not present

## 2016-07-28 DIAGNOSIS — M19021 Primary osteoarthritis, right elbow: Secondary | ICD-10-CM | POA: Diagnosis not present

## 2016-07-28 DIAGNOSIS — R531 Weakness: Secondary | ICD-10-CM | POA: Diagnosis not present

## 2016-07-28 DIAGNOSIS — G8929 Other chronic pain: Secondary | ICD-10-CM | POA: Diagnosis not present

## 2016-07-28 DIAGNOSIS — R296 Repeated falls: Secondary | ICD-10-CM | POA: Diagnosis not present

## 2016-07-28 DIAGNOSIS — R269 Unspecified abnormalities of gait and mobility: Secondary | ICD-10-CM | POA: Diagnosis not present

## 2016-08-04 DIAGNOSIS — R269 Unspecified abnormalities of gait and mobility: Secondary | ICD-10-CM | POA: Diagnosis not present

## 2016-08-04 DIAGNOSIS — R531 Weakness: Secondary | ICD-10-CM | POA: Diagnosis not present

## 2016-08-04 DIAGNOSIS — R296 Repeated falls: Secondary | ICD-10-CM | POA: Diagnosis not present

## 2016-08-04 DIAGNOSIS — M19021 Primary osteoarthritis, right elbow: Secondary | ICD-10-CM | POA: Diagnosis not present

## 2016-08-04 DIAGNOSIS — I10 Essential (primary) hypertension: Secondary | ICD-10-CM | POA: Diagnosis not present

## 2016-08-04 DIAGNOSIS — G8929 Other chronic pain: Secondary | ICD-10-CM | POA: Diagnosis not present

## 2016-08-06 DIAGNOSIS — G8929 Other chronic pain: Secondary | ICD-10-CM | POA: Diagnosis not present

## 2016-08-06 DIAGNOSIS — I10 Essential (primary) hypertension: Secondary | ICD-10-CM | POA: Diagnosis not present

## 2016-08-06 DIAGNOSIS — R296 Repeated falls: Secondary | ICD-10-CM | POA: Diagnosis not present

## 2016-08-06 DIAGNOSIS — R531 Weakness: Secondary | ICD-10-CM | POA: Diagnosis not present

## 2016-08-06 DIAGNOSIS — R269 Unspecified abnormalities of gait and mobility: Secondary | ICD-10-CM | POA: Diagnosis not present

## 2016-08-06 DIAGNOSIS — M19021 Primary osteoarthritis, right elbow: Secondary | ICD-10-CM | POA: Diagnosis not present

## 2016-08-08 DIAGNOSIS — G8929 Other chronic pain: Secondary | ICD-10-CM | POA: Diagnosis not present

## 2016-08-08 DIAGNOSIS — R269 Unspecified abnormalities of gait and mobility: Secondary | ICD-10-CM | POA: Diagnosis not present

## 2016-08-08 DIAGNOSIS — M19021 Primary osteoarthritis, right elbow: Secondary | ICD-10-CM | POA: Diagnosis not present

## 2016-08-08 DIAGNOSIS — R531 Weakness: Secondary | ICD-10-CM | POA: Diagnosis not present

## 2016-08-08 DIAGNOSIS — I10 Essential (primary) hypertension: Secondary | ICD-10-CM | POA: Diagnosis not present

## 2016-08-08 DIAGNOSIS — R296 Repeated falls: Secondary | ICD-10-CM | POA: Diagnosis not present

## 2016-08-09 DIAGNOSIS — I10 Essential (primary) hypertension: Secondary | ICD-10-CM | POA: Diagnosis not present

## 2016-08-09 DIAGNOSIS — R296 Repeated falls: Secondary | ICD-10-CM | POA: Diagnosis not present

## 2016-08-09 DIAGNOSIS — G8929 Other chronic pain: Secondary | ICD-10-CM | POA: Diagnosis not present

## 2016-08-09 DIAGNOSIS — R269 Unspecified abnormalities of gait and mobility: Secondary | ICD-10-CM | POA: Diagnosis not present

## 2016-08-09 DIAGNOSIS — R531 Weakness: Secondary | ICD-10-CM | POA: Diagnosis not present

## 2016-08-09 DIAGNOSIS — M19021 Primary osteoarthritis, right elbow: Secondary | ICD-10-CM | POA: Diagnosis not present

## 2016-08-10 DIAGNOSIS — M19021 Primary osteoarthritis, right elbow: Secondary | ICD-10-CM | POA: Diagnosis not present

## 2016-08-10 DIAGNOSIS — R69 Illness, unspecified: Secondary | ICD-10-CM | POA: Diagnosis not present

## 2016-08-10 DIAGNOSIS — G309 Alzheimer's disease, unspecified: Secondary | ICD-10-CM | POA: Diagnosis not present

## 2016-08-10 DIAGNOSIS — G3184 Mild cognitive impairment, so stated: Secondary | ICD-10-CM | POA: Diagnosis not present

## 2016-08-10 DIAGNOSIS — I10 Essential (primary) hypertension: Secondary | ICD-10-CM | POA: Diagnosis not present

## 2016-08-10 DIAGNOSIS — M545 Low back pain: Secondary | ICD-10-CM | POA: Diagnosis not present

## 2016-08-10 DIAGNOSIS — G8929 Other chronic pain: Secondary | ICD-10-CM | POA: Diagnosis not present

## 2016-08-10 DIAGNOSIS — R2689 Other abnormalities of gait and mobility: Secondary | ICD-10-CM | POA: Diagnosis not present

## 2016-08-11 DIAGNOSIS — G8929 Other chronic pain: Secondary | ICD-10-CM | POA: Diagnosis not present

## 2016-08-11 DIAGNOSIS — R2689 Other abnormalities of gait and mobility: Secondary | ICD-10-CM | POA: Diagnosis not present

## 2016-08-11 DIAGNOSIS — I10 Essential (primary) hypertension: Secondary | ICD-10-CM | POA: Diagnosis not present

## 2016-08-11 DIAGNOSIS — M19021 Primary osteoarthritis, right elbow: Secondary | ICD-10-CM | POA: Diagnosis not present

## 2016-08-11 DIAGNOSIS — G3184 Mild cognitive impairment, so stated: Secondary | ICD-10-CM | POA: Diagnosis not present

## 2016-08-11 DIAGNOSIS — M545 Low back pain: Secondary | ICD-10-CM | POA: Diagnosis not present

## 2016-08-17 DIAGNOSIS — G309 Alzheimer's disease, unspecified: Secondary | ICD-10-CM | POA: Diagnosis not present

## 2016-08-17 DIAGNOSIS — I10 Essential (primary) hypertension: Secondary | ICD-10-CM | POA: Diagnosis not present

## 2016-08-18 DIAGNOSIS — G8929 Other chronic pain: Secondary | ICD-10-CM | POA: Diagnosis not present

## 2016-08-18 DIAGNOSIS — M19021 Primary osteoarthritis, right elbow: Secondary | ICD-10-CM | POA: Diagnosis not present

## 2016-08-18 DIAGNOSIS — G3184 Mild cognitive impairment, so stated: Secondary | ICD-10-CM | POA: Diagnosis not present

## 2016-08-18 DIAGNOSIS — M545 Low back pain: Secondary | ICD-10-CM | POA: Diagnosis not present

## 2016-08-18 DIAGNOSIS — R2689 Other abnormalities of gait and mobility: Secondary | ICD-10-CM | POA: Diagnosis not present

## 2016-08-18 DIAGNOSIS — I10 Essential (primary) hypertension: Secondary | ICD-10-CM | POA: Diagnosis not present

## 2016-08-19 DIAGNOSIS — G3184 Mild cognitive impairment, so stated: Secondary | ICD-10-CM | POA: Diagnosis not present

## 2016-08-19 DIAGNOSIS — M545 Low back pain: Secondary | ICD-10-CM | POA: Diagnosis not present

## 2016-08-19 DIAGNOSIS — G8929 Other chronic pain: Secondary | ICD-10-CM | POA: Diagnosis not present

## 2016-08-19 DIAGNOSIS — I10 Essential (primary) hypertension: Secondary | ICD-10-CM | POA: Diagnosis not present

## 2016-08-19 DIAGNOSIS — R2689 Other abnormalities of gait and mobility: Secondary | ICD-10-CM | POA: Diagnosis not present

## 2016-08-19 DIAGNOSIS — M19021 Primary osteoarthritis, right elbow: Secondary | ICD-10-CM | POA: Diagnosis not present

## 2016-08-22 DIAGNOSIS — Q659 Congenital deformity of hip, unspecified: Secondary | ICD-10-CM | POA: Diagnosis not present

## 2016-08-22 DIAGNOSIS — E785 Hyperlipidemia, unspecified: Secondary | ICD-10-CM | POA: Diagnosis not present

## 2016-08-22 DIAGNOSIS — L0390 Cellulitis, unspecified: Secondary | ICD-10-CM | POA: Diagnosis not present

## 2016-08-22 DIAGNOSIS — Z96649 Presence of unspecified artificial hip joint: Secondary | ICD-10-CM | POA: Diagnosis not present

## 2016-08-25 DIAGNOSIS — I10 Essential (primary) hypertension: Secondary | ICD-10-CM | POA: Diagnosis not present

## 2016-08-25 DIAGNOSIS — Z0000 Encounter for general adult medical examination without abnormal findings: Secondary | ICD-10-CM | POA: Diagnosis not present

## 2016-08-25 DIAGNOSIS — R2689 Other abnormalities of gait and mobility: Secondary | ICD-10-CM | POA: Diagnosis not present

## 2016-08-25 DIAGNOSIS — K219 Gastro-esophageal reflux disease without esophagitis: Secondary | ICD-10-CM | POA: Diagnosis not present

## 2016-08-25 DIAGNOSIS — M19021 Primary osteoarthritis, right elbow: Secondary | ICD-10-CM | POA: Diagnosis not present

## 2016-08-25 DIAGNOSIS — G8929 Other chronic pain: Secondary | ICD-10-CM | POA: Diagnosis not present

## 2016-08-25 DIAGNOSIS — R69 Illness, unspecified: Secondary | ICD-10-CM | POA: Diagnosis not present

## 2016-08-25 DIAGNOSIS — G3184 Mild cognitive impairment, so stated: Secondary | ICD-10-CM | POA: Diagnosis not present

## 2016-08-25 DIAGNOSIS — G311 Senile degeneration of brain, not elsewhere classified: Secondary | ICD-10-CM | POA: Diagnosis not present

## 2016-08-25 DIAGNOSIS — M545 Low back pain: Secondary | ICD-10-CM | POA: Diagnosis not present

## 2016-08-27 DIAGNOSIS — I10 Essential (primary) hypertension: Secondary | ICD-10-CM | POA: Diagnosis not present

## 2016-08-27 DIAGNOSIS — R2689 Other abnormalities of gait and mobility: Secondary | ICD-10-CM | POA: Diagnosis not present

## 2016-08-27 DIAGNOSIS — G3184 Mild cognitive impairment, so stated: Secondary | ICD-10-CM | POA: Diagnosis not present

## 2016-08-27 DIAGNOSIS — M19021 Primary osteoarthritis, right elbow: Secondary | ICD-10-CM | POA: Diagnosis not present

## 2016-08-27 DIAGNOSIS — M545 Low back pain: Secondary | ICD-10-CM | POA: Diagnosis not present

## 2016-08-27 DIAGNOSIS — G8929 Other chronic pain: Secondary | ICD-10-CM | POA: Diagnosis not present

## 2016-08-30 DIAGNOSIS — B351 Tinea unguium: Secondary | ICD-10-CM | POA: Diagnosis not present

## 2016-08-30 DIAGNOSIS — L600 Ingrowing nail: Secondary | ICD-10-CM | POA: Diagnosis not present

## 2016-08-30 DIAGNOSIS — R2681 Unsteadiness on feet: Secondary | ICD-10-CM | POA: Diagnosis not present

## 2016-08-30 DIAGNOSIS — M797 Fibromyalgia: Secondary | ICD-10-CM | POA: Diagnosis not present

## 2016-08-31 DIAGNOSIS — Z961 Presence of intraocular lens: Secondary | ICD-10-CM | POA: Diagnosis not present

## 2016-09-01 DIAGNOSIS — M545 Low back pain: Secondary | ICD-10-CM | POA: Diagnosis not present

## 2016-09-01 DIAGNOSIS — G8929 Other chronic pain: Secondary | ICD-10-CM | POA: Diagnosis not present

## 2016-09-01 DIAGNOSIS — I10 Essential (primary) hypertension: Secondary | ICD-10-CM | POA: Diagnosis not present

## 2016-09-01 DIAGNOSIS — R2689 Other abnormalities of gait and mobility: Secondary | ICD-10-CM | POA: Diagnosis not present

## 2016-09-01 DIAGNOSIS — M19021 Primary osteoarthritis, right elbow: Secondary | ICD-10-CM | POA: Diagnosis not present

## 2016-09-01 DIAGNOSIS — G3184 Mild cognitive impairment, so stated: Secondary | ICD-10-CM | POA: Diagnosis not present

## 2016-09-03 DIAGNOSIS — R2689 Other abnormalities of gait and mobility: Secondary | ICD-10-CM | POA: Diagnosis not present

## 2016-09-03 DIAGNOSIS — G8929 Other chronic pain: Secondary | ICD-10-CM | POA: Diagnosis not present

## 2016-09-03 DIAGNOSIS — G3184 Mild cognitive impairment, so stated: Secondary | ICD-10-CM | POA: Diagnosis not present

## 2016-09-03 DIAGNOSIS — M545 Low back pain: Secondary | ICD-10-CM | POA: Diagnosis not present

## 2016-09-03 DIAGNOSIS — I10 Essential (primary) hypertension: Secondary | ICD-10-CM | POA: Diagnosis not present

## 2016-09-03 DIAGNOSIS — M19021 Primary osteoarthritis, right elbow: Secondary | ICD-10-CM | POA: Diagnosis not present

## 2016-09-09 DIAGNOSIS — G309 Alzheimer's disease, unspecified: Secondary | ICD-10-CM | POA: Diagnosis not present

## 2016-09-09 DIAGNOSIS — R54 Age-related physical debility: Secondary | ICD-10-CM | POA: Diagnosis not present

## 2016-09-09 DIAGNOSIS — K219 Gastro-esophageal reflux disease without esophagitis: Secondary | ICD-10-CM | POA: Diagnosis not present

## 2016-09-09 DIAGNOSIS — R2689 Other abnormalities of gait and mobility: Secondary | ICD-10-CM | POA: Diagnosis not present

## 2016-09-09 DIAGNOSIS — R269 Unspecified abnormalities of gait and mobility: Secondary | ICD-10-CM | POA: Diagnosis not present

## 2016-09-09 DIAGNOSIS — G3184 Mild cognitive impairment, so stated: Secondary | ICD-10-CM | POA: Diagnosis not present

## 2016-09-09 DIAGNOSIS — M545 Low back pain: Secondary | ICD-10-CM | POA: Diagnosis not present

## 2016-09-09 DIAGNOSIS — R69 Illness, unspecified: Secondary | ICD-10-CM | POA: Diagnosis not present

## 2016-09-09 DIAGNOSIS — I10 Essential (primary) hypertension: Secondary | ICD-10-CM | POA: Diagnosis not present

## 2016-09-09 DIAGNOSIS — M1990 Unspecified osteoarthritis, unspecified site: Secondary | ICD-10-CM | POA: Diagnosis not present

## 2016-09-09 DIAGNOSIS — G8929 Other chronic pain: Secondary | ICD-10-CM | POA: Diagnosis not present

## 2016-09-09 DIAGNOSIS — M19021 Primary osteoarthritis, right elbow: Secondary | ICD-10-CM | POA: Diagnosis not present

## 2016-09-10 DIAGNOSIS — M19021 Primary osteoarthritis, right elbow: Secondary | ICD-10-CM | POA: Diagnosis not present

## 2016-09-10 DIAGNOSIS — R2689 Other abnormalities of gait and mobility: Secondary | ICD-10-CM | POA: Diagnosis not present

## 2016-09-10 DIAGNOSIS — G8929 Other chronic pain: Secondary | ICD-10-CM | POA: Diagnosis not present

## 2016-09-10 DIAGNOSIS — I10 Essential (primary) hypertension: Secondary | ICD-10-CM | POA: Diagnosis not present

## 2016-09-10 DIAGNOSIS — G3184 Mild cognitive impairment, so stated: Secondary | ICD-10-CM | POA: Diagnosis not present

## 2016-09-10 DIAGNOSIS — M545 Low back pain: Secondary | ICD-10-CM | POA: Diagnosis not present

## 2016-09-11 ENCOUNTER — Emergency Department
Admission: EM | Admit: 2016-09-11 | Discharge: 2016-09-11 | Disposition: A | Discharge status: Home / Self Care | Payer: Medicare HMO | Attending: Emergency Medicine | Admitting: Emergency Medicine

## 2016-09-11 ENCOUNTER — Encounter: Payer: Self-pay | Admitting: Emergency Medicine

## 2016-09-11 ENCOUNTER — Emergency Department: Payer: Medicare HMO

## 2016-09-11 DIAGNOSIS — S0990XA Unspecified injury of head, initial encounter: Secondary | ICD-10-CM | POA: Diagnosis not present

## 2016-09-11 DIAGNOSIS — Y999 Unspecified external cause status: Secondary | ICD-10-CM | POA: Insufficient documentation

## 2016-09-11 DIAGNOSIS — I129 Hypertensive chronic kidney disease with stage 1 through stage 4 chronic kidney disease, or unspecified chronic kidney disease: Secondary | ICD-10-CM | POA: Diagnosis not present

## 2016-09-11 DIAGNOSIS — W19XXXA Unspecified fall, initial encounter: Secondary | ICD-10-CM | POA: Diagnosis not present

## 2016-09-11 DIAGNOSIS — G309 Alzheimer's disease, unspecified: Secondary | ICD-10-CM | POA: Diagnosis not present

## 2016-09-11 DIAGNOSIS — Y92128 Other place in nursing home as the place of occurrence of the external cause: Secondary | ICD-10-CM | POA: Insufficient documentation

## 2016-09-11 DIAGNOSIS — Y9301 Activity, walking, marching and hiking: Secondary | ICD-10-CM | POA: Insufficient documentation

## 2016-09-11 DIAGNOSIS — N189 Chronic kidney disease, unspecified: Secondary | ICD-10-CM | POA: Insufficient documentation

## 2016-09-11 DIAGNOSIS — S0083XA Contusion of other part of head, initial encounter: Secondary | ICD-10-CM | POA: Diagnosis not present

## 2016-09-11 DIAGNOSIS — W01198A Fall on same level from slipping, tripping and stumbling with subsequent striking against other object, initial encounter: Secondary | ICD-10-CM | POA: Diagnosis not present

## 2016-09-11 DIAGNOSIS — S0993XA Unspecified injury of face, initial encounter: Secondary | ICD-10-CM | POA: Diagnosis not present

## 2016-09-11 DIAGNOSIS — S199XXA Unspecified injury of neck, initial encounter: Secondary | ICD-10-CM | POA: Diagnosis not present

## 2016-09-11 LAB — GLUCOSE, CAPILLARY: Glucose-Capillary: 99 mg/dL (ref 65–99)

## 2016-09-11 MED ORDER — ACETAMINOPHEN 325 MG PO TABS
650.0000 mg | ORAL_TABLET | Freq: Once | ORAL | Status: AC
  Administered 2016-09-11: 650 mg via ORAL
  Filled 2016-09-11: qty 2

## 2016-09-11 NOTE — ED Notes (Signed)
Attempted to call report to [facility], no answer.

## 2016-09-11 NOTE — ED Triage Notes (Signed)
Patient presents to Emergency Department via EMS with complaints of fall.  Pt from [facility], per EMS pt was walking and tripped striking her right eye (obvious injury with swelling) on the handle of the walker, contusion to center forehead, and lac to posterior right hand.  No LOC and reports pain in face.  No sign of blood thinners on MAR, hx of dementia.

## 2016-09-11 NOTE — ED Provider Notes (Signed)
Time Seen: Approximately 1630  I have reviewed the triage notes  Chief Complaint: Fall   History of Present Illness: Caroline Wilkerson is a 82 y.o. female who presents from local nursing facility after a non-syncopal fall. Patient apparently was ambulating with her  walker. She lost her balance and struck her face on the handle of the walker. No loss of consciousness after a fall. Mostly complains of right-sided facial pain. He has a history of dementia and denies any visual field disturbances from her right eye.   Past Medical History:  Diagnosis Date  . Alzheimer's dementia   . Arthritis   . Cardiomegaly   . Chronic kidney disease   . Fibrocystic breast   . Hearing loss   . Hyperlipidemia   . Hypertension   . Vertigo     Patient Active Problem List   Diagnosis Date Noted  . Cellulitis 02/17/2016  . Generalized weakness 02/17/2016  . Decubitus ulcer of buttock, stage 1 10/21/2015  . Hypertension   . Hyperlipidemia   . Alzheimer's dementia     Past Surgical History:  Procedure Laterality Date  . ABDOMINAL HYSTERECTOMY    . BREAST SURGERY    . CARPAL TUNNEL RELEASE    . CATARACT EXTRACTION Bilateral   . CHOLECYSTECTOMY    . JOINT REPLACEMENT Right    knee and hip  . rotator cuff surgery Bilateral     Past Surgical History:  Procedure Laterality Date  . ABDOMINAL HYSTERECTOMY    . BREAST SURGERY    . CARPAL TUNNEL RELEASE    . CATARACT EXTRACTION Bilateral   . CHOLECYSTECTOMY    . JOINT REPLACEMENT Right    knee and hip  . rotator cuff surgery Bilateral     Current Outpatient Rx  . Order #: [id number] Class: Historical Med  . Order #: [id number] Class: Historical Med  . Order #: [id number] Class: Normal  . Order #: [id number] Class: Historical Med  . Order #: [id number] Class: Print  . Order #: [id number] Class: Normal  . Order #: [id number] Class: Historical Med    Allergies:  Codeine  Family History: Family History  Problem Relation Age of Onset  .  Hypertension Daughter   . Hypertension Son   . Diabetes Son     Social History: Social History  Substance Use Topics  . Smoking status: Never Smoker  . Smokeless tobacco: Never Used  . Alcohol use No     Review of Systems:   10 point review of systems was performed and was otherwise negative:  Constitutional: No fever Eyes: No visual disturbances ENT: No sore throat, ear pain Cardiac: No chest pain Respiratory: No shortness of breath, wheezing, or stridor Abdomen: No abdominal pain, no vomiting, No diarrhea Endocrine: No weight loss, No night sweats Extremities: No peripheral edema, cyanosis Skin: No rashes, easy bruising Neurologic: No focal weakness, trouble with speech or swollowing Urologic: No dysuria, Hematuria, or urinary frequency   Physical Exam:  ED Triage Vitals [09/11/16 1600]  Enc Vitals Group     BP (!) 137/100     Pulse Rate 65     Resp (!) 29     Temp 98 F (36.7 C)     Temp Source Oral     SpO2 98 %     Weight 172 lb 9.6 oz (78.3 kg)     Height 5\' 6"  (1.676 m)     Head Circumference      Peak Flow      Pain Score  Pain Loc      Pain Edu?      Excl. in GC?     General: Awake , Alert , and Oriented times 2. (Normal mental status). Head: Patient has ecchymosis surrounding the right eye especially underneath the right eye. There is a superficial laceration skin tear approximately 3 cm below the eye. There is no crepitus or midface instability she has a left frontal contusion  Eyes: Pupils equal , round, reactive to light. No hyphema Nose/Throat: No nasal drainage, patent upper airway without erythema or exudate.  Neck: Supple, Full range of motion, No anterior adenopathy or palpable thyroid masses Lungs: Clear to ascultation without wheezes , rhonchi, or rales Heart: Regular rate, regular rhythm without murmurs , gallops , or rubs Abdomen: Soft, non tender without rebound, guarding , or rigidity; bowel sounds positive and symmetric in all 4  quadrants. No organomegaly .        Extremities: Patient has some superficial skin tears on the right and left upper extremity on the hands. She has good hand grasp and good supination and pronation of both upper extremities. No reproducible pain or crepitus. Neurologic: normal ambulation, Motor symmetric without deficits, sensory intact Skin: warm, dry, no rashes   Labs:   All laboratory work was reviewed including any pertinent negatives or positives listed below:  Labs Reviewed  GLUCOSE, CAPILLARY    EKG: * ED ECG REPORT I, Jennye Moccasin, the attending physician, personally viewed and interpreted this ECG.  Date: 09/11/2016 EKG Time: 1558 Rate: 68 Rhythm: normal sinus rhythm QRS Axis: normal Intervals: Right bundle-branch block and left anterior fascicular block ST/T Wave abnormalities: normal Conduction Disturbances: none Narrative Interpretation: unremarkable No acute ischemic changes   Radiology: * "Ct Head Wo Contrast  Result Date: 09/11/2016 CLINICAL DATA:  Fall, head injury. Large midline hematoma to forehead. EXAM: CT HEAD WITHOUT CONTRAST CT MAXILLOFACIAL WITHOUT CONTRAST CT CERVICAL SPINE WITHOUT CONTRAST TECHNIQUE: Multidetector CT imaging of the head, cervical spine, and maxillofacial structures were performed using the standard protocol without intravenous contrast. Multiplanar CT image reconstructions of the cervical spine and maxillofacial structures were also generated. COMPARISON:  None. FINDINGS: CT HEAD FINDINGS Brain: There is mild generalized age related parenchymal atrophy with commensurate dilatation of the ventricles and sulci. Chronic small vessel ischemic changes noted within the bilateral periventricular and subcortical white matter regions. Small old lacunar infarct noted within the right basal ganglia. There is no mass, hemorrhage, edema or other evidence of acute parenchymal abnormality. No extra-axial hemorrhage. Vascular: There are chronic calcified  atherosclerotic changes of the large vessels at the skull base. No unexpected hyperdense vessel. Skull: Normal. Negative for fracture or focal lesion. Sinuses/Orbits: Chronic-appearing left maxillary sinus disease. Otherwise unremarkable. Other: Focal soft tissue edema overlying the lower midline frontal bones, measuring approximately 3 cm in width and 1 cm thickness. No underlying fracture. Additional soft tissue swelling/ edema overlying the right orbit without underlying fracture. CT MAXILLOFACIAL FINDINGS Osseous: Lower frontal bones are intact and normally aligned bilaterally. No displaced nasal bone fracture. Osseous structures about the orbits are intact and normally aligned bilaterally. Bilateral zygoma and pterygoid plates are intact. No mandible fracture or displacement. Incidental note made of advanced degenerative change at the right TMJ. Orbits: Soft tissue swelling/edema overlying the right orbit. No periorbital and retro-orbital edema. Both orbital globes appear grossly normal in position and configuration. Sinuses: Chronic-appearing left maxillary sinus disease. Otherwise clear. Soft tissues: Focal soft tissue edema/hematoma overlying the lower midline frontal bones, measuring approximately 3  cm in width and 1 cm in thickness. No underlying fracture. Also soft tissue edema overlying the right orbit. No underlying fracture. Soft tissues overlying the facial bones are otherwise unremarkable. Limited intracranial: No significant or unexpected finding. CT CERVICAL SPINE FINDINGS Alignment: Slight reversal of the normal cervical lordosis which is likely related to the underlying degenerative changes throughout the cervical spine. Also mild levoscoliosis. No evidence of acute subluxation. Skull base and vertebrae: No fracture line or displaced fracture fragment identified. Facet joints appear normally aligned throughout. Soft tissues and spinal canal: No prevertebral fluid or swelling. No visible canal  hematoma. Atherosclerotic changes noted at each carotid bulb region. Disc levels: Degenerative changes are seen throughout the cervical spine, moderate in degree with associated disc space narrowings and osseous spurring. Associated disc-osteophytic bulges are appreciated at each level but there is no more than mild central canal stenosis seen at any level. Upper chest: Atherosclerotic calcifications of the thoracic aortic arch, incompletely imaged. Otherwise unremarkable. Other: None IMPRESSION: 1. Focal soft tissue hematoma overlying the lower midline frontal bones, measuring approximately 3 cm in width and 1 cm in thickness. No underlying fracture. 2. Additional soft tissue edema/swelling overlying the right orbit. No underlying fracture. No retro-orbital fluid or edema. 3. No acute intracranial abnormality. No intracranial hemorrhage or edema. 4. No facial bone fracture or dislocation. 5. No fracture or acute subluxation within the cervical spine. Degenerative changes of the cervical spine, as detailed above. 6. Aortic atherosclerosis. Additional chronic/incidental findings detailed above. Electronically Signed   By: Bary Richard M.D.   On: 09/11/2016 16:37   Ct Cervical Spine Wo Contrast  Result Date: 09/11/2016 CLINICAL DATA:  Fall, head injury. Large midline hematoma to forehead. EXAM: CT HEAD WITHOUT CONTRAST CT MAXILLOFACIAL WITHOUT CONTRAST CT CERVICAL SPINE WITHOUT CONTRAST TECHNIQUE: Multidetector CT imaging of the head, cervical spine, and maxillofacial structures were performed using the standard protocol without intravenous contrast. Multiplanar CT image reconstructions of the cervical spine and maxillofacial structures were also generated. COMPARISON:  None. FINDINGS: CT HEAD FINDINGS Brain: There is mild generalized age related parenchymal atrophy with commensurate dilatation of the ventricles and sulci. Chronic small vessel ischemic changes noted within the bilateral periventricular and  subcortical white matter regions. Small old lacunar infarct noted within the right basal ganglia. There is no mass, hemorrhage, edema or other evidence of acute parenchymal abnormality. No extra-axial hemorrhage. Vascular: There are chronic calcified atherosclerotic changes of the large vessels at the skull base. No unexpected hyperdense vessel. Skull: Normal. Negative for fracture or focal lesion. Sinuses/Orbits: Chronic-appearing left maxillary sinus disease. Otherwise unremarkable. Other: Focal soft tissue edema overlying the lower midline frontal bones, measuring approximately 3 cm in width and 1 cm thickness. No underlying fracture. Additional soft tissue swelling/ edema overlying the right orbit without underlying fracture. CT MAXILLOFACIAL FINDINGS Osseous: Lower frontal bones are intact and normally aligned bilaterally. No displaced nasal bone fracture. Osseous structures about the orbits are intact and normally aligned bilaterally. Bilateral zygoma and pterygoid plates are intact. No mandible fracture or displacement. Incidental note made of advanced degenerative change at the right TMJ. Orbits: Soft tissue swelling/edema overlying the right orbit. No periorbital and retro-orbital edema. Both orbital globes appear grossly normal in position and configuration. Sinuses: Chronic-appearing left maxillary sinus disease. Otherwise clear. Soft tissues: Focal soft tissue edema/hematoma overlying the lower midline frontal bones, measuring approximately 3 cm in width and 1 cm in thickness. No underlying fracture. Also soft tissue edema overlying the right orbit. No underlying fracture. Soft  tissues overlying the facial bones are otherwise unremarkable. Limited intracranial: No significant or unexpected finding. CT CERVICAL SPINE FINDINGS Alignment: Slight reversal of the normal cervical lordosis which is likely related to the underlying degenerative changes throughout the cervical spine. Also mild levoscoliosis. No  evidence of acute subluxation. Skull base and vertebrae: No fracture line or displaced fracture fragment identified. Facet joints appear normally aligned throughout. Soft tissues and spinal canal: No prevertebral fluid or swelling. No visible canal hematoma. Atherosclerotic changes noted at each carotid bulb region. Disc levels: Degenerative changes are seen throughout the cervical spine, moderate in degree with associated disc space narrowings and osseous spurring. Associated disc-osteophytic bulges are appreciated at each level but there is no more than mild central canal stenosis seen at any level. Upper chest: Atherosclerotic calcifications of the thoracic aortic arch, incompletely imaged. Otherwise unremarkable. Other: None IMPRESSION: 1. Focal soft tissue hematoma overlying the lower midline frontal bones, measuring approximately 3 cm in width and 1 cm in thickness. No underlying fracture. 2. Additional soft tissue edema/swelling overlying the right orbit. No underlying fracture. No retro-orbital fluid or edema. 3. No acute intracranial abnormality. No intracranial hemorrhage or edema. 4. No facial bone fracture or dislocation. 5. No fracture or acute subluxation within the cervical spine. Degenerative changes of the cervical spine, as detailed above. 6. Aortic atherosclerosis. Additional chronic/incidental findings detailed above. Electronically Signed   By: Bary Richard M.D.   On: 09/11/2016 16:37   Ct Maxillofacial Wo Contrast  Result Date: 09/11/2016 CLINICAL DATA:  Fall, head injury. Large midline hematoma to forehead. EXAM: CT HEAD WITHOUT CONTRAST CT MAXILLOFACIAL WITHOUT CONTRAST CT CERVICAL SPINE WITHOUT CONTRAST TECHNIQUE: Multidetector CT imaging of the head, cervical spine, and maxillofacial structures were performed using the standard protocol without intravenous contrast. Multiplanar CT image reconstructions of the cervical spine and maxillofacial structures were also generated. COMPARISON:   None. FINDINGS: CT HEAD FINDINGS Brain: There is mild generalized age related parenchymal atrophy with commensurate dilatation of the ventricles and sulci. Chronic small vessel ischemic changes noted within the bilateral periventricular and subcortical white matter regions. Small old lacunar infarct noted within the right basal ganglia. There is no mass, hemorrhage, edema or other evidence of acute parenchymal abnormality. No extra-axial hemorrhage. Vascular: There are chronic calcified atherosclerotic changes of the large vessels at the skull base. No unexpected hyperdense vessel. Skull: Normal. Negative for fracture or focal lesion. Sinuses/Orbits: Chronic-appearing left maxillary sinus disease. Otherwise unremarkable. Other: Focal soft tissue edema overlying the lower midline frontal bones, measuring approximately 3 cm in width and 1 cm thickness. No underlying fracture. Additional soft tissue swelling/ edema overlying the right orbit without underlying fracture. CT MAXILLOFACIAL FINDINGS Osseous: Lower frontal bones are intact and normally aligned bilaterally. No displaced nasal bone fracture. Osseous structures about the orbits are intact and normally aligned bilaterally. Bilateral zygoma and pterygoid plates are intact. No mandible fracture or displacement. Incidental note made of advanced degenerative change at the right TMJ. Orbits: Soft tissue swelling/edema overlying the right orbit. No periorbital and retro-orbital edema. Both orbital globes appear grossly normal in position and configuration. Sinuses: Chronic-appearing left maxillary sinus disease. Otherwise clear. Soft tissues: Focal soft tissue edema/hematoma overlying the lower midline frontal bones, measuring approximately 3 cm in width and 1 cm in thickness. No underlying fracture. Also soft tissue edema overlying the right orbit. No underlying fracture. Soft tissues overlying the facial bones are otherwise unremarkable. Limited intracranial: No  significant or unexpected finding. CT CERVICAL SPINE FINDINGS Alignment: Slight reversal of  the normal cervical lordosis which is likely related to the underlying degenerative changes throughout the cervical spine. Also mild levoscoliosis. No evidence of acute subluxation. Skull base and vertebrae: No fracture line or displaced fracture fragment identified. Facet joints appear normally aligned throughout. Soft tissues and spinal canal: No prevertebral fluid or swelling. No visible canal hematoma. Atherosclerotic changes noted at each carotid bulb region. Disc levels: Degenerative changes are seen throughout the cervical spine, moderate in degree with associated disc space narrowings and osseous spurring. Associated disc-osteophytic bulges are appreciated at each level but there is no more than mild central canal stenosis seen at any level. Upper chest: Atherosclerotic calcifications of the thoracic aortic arch, incompletely imaged. Otherwise unremarkable. Other: None IMPRESSION: 1. Focal soft tissue hematoma overlying the lower midline frontal bones, measuring approximately 3 cm in width and 1 cm in thickness. No underlying fracture. 2. Additional soft tissue edema/swelling overlying the right orbit. No underlying fracture. No retro-orbital fluid or edema. 3. No acute intracranial abnormality. No intracranial hemorrhage or edema. 4. No facial bone fracture or dislocation. 5. No fracture or acute subluxation within the cervical spine. Degenerative changes of the cervical spine, as detailed above. 6. Aortic atherosclerosis. Additional chronic/incidental findings detailed above. Electronically Signed   By: Bary Richard M.D.   On: 09/11/2016 16:37  "  I personally reviewed the radiologic studies   Procedures: * Patient had Dermabond applied to the 3 cm syrup superficial laceration below her eye. There is no deep tissue involvement. Patient appeared to tolerate the procedure well. Dermabond was placed after the  wound was cleaned.   ED Course:  Patient's stay here was uneventful and she is at her normal mental status and there is no CT evidence of an intracranial pathology. CT of the cervical spine was done as precautionary measure due to the significance of her head trauma. There does not appear to be any abnormalities noted of significance. The patient had small skin tears on both hands which were cleaned and dressed. Clinical Course     Assessment: * Status post fall with acute closed head injury Superficial laceration Dermabond 3 cm below the right eye Skin tears on both hands   Final Clinical Impression: *  Final diagnoses:  Head injury, initial encounter     Plan: Outpatient Patient was advised to return immediately if condition worsens. Patient was advised to follow up with their primary care physician or other specialized physicians involved in their outpatient care. The patient and/or family member/power of attorney had laboratory results reviewed at the bedside. All questions and concerns were addressed and appropriate discharge instructions were distributed by the nursing staff.             Jennye Moccasin, MD 09/11/16 [phone]

## 2016-09-11 NOTE — ED Notes (Signed)
Patient transported to CT 

## 2016-09-11 NOTE — Discharge Instructions (Signed)
Please return immediately if condition worsens. Please contact her primary physician or the physician you were given for referral. If you have any specialist physicians involved in her treatment and plan please also contact them. Thank you for using [facility]. ° °

## 2016-09-11 NOTE — ED Notes (Signed)
Attempted to call [facility] a second time, no answer.

## 2016-09-13 DIAGNOSIS — M19021 Primary osteoarthritis, right elbow: Secondary | ICD-10-CM | POA: Diagnosis not present

## 2016-09-13 DIAGNOSIS — I10 Essential (primary) hypertension: Secondary | ICD-10-CM | POA: Diagnosis not present

## 2016-09-13 DIAGNOSIS — G8929 Other chronic pain: Secondary | ICD-10-CM | POA: Diagnosis not present

## 2016-09-13 DIAGNOSIS — M545 Low back pain: Secondary | ICD-10-CM | POA: Diagnosis not present

## 2016-09-13 DIAGNOSIS — R2689 Other abnormalities of gait and mobility: Secondary | ICD-10-CM | POA: Diagnosis not present

## 2016-09-13 DIAGNOSIS — G3184 Mild cognitive impairment, so stated: Secondary | ICD-10-CM | POA: Diagnosis not present

## 2016-09-14 DIAGNOSIS — I10 Essential (primary) hypertension: Secondary | ICD-10-CM | POA: Diagnosis not present

## 2016-09-14 DIAGNOSIS — W19XXXS Unspecified fall, sequela: Secondary | ICD-10-CM | POA: Diagnosis not present

## 2016-09-14 DIAGNOSIS — G309 Alzheimer's disease, unspecified: Secondary | ICD-10-CM | POA: Diagnosis not present

## 2016-09-14 DIAGNOSIS — S0010XA Contusion of unspecified eyelid and periocular area, initial encounter: Secondary | ICD-10-CM | POA: Diagnosis not present

## 2016-09-14 DIAGNOSIS — M25521 Pain in right elbow: Secondary | ICD-10-CM | POA: Diagnosis not present

## 2016-09-14 DIAGNOSIS — R269 Unspecified abnormalities of gait and mobility: Secondary | ICD-10-CM | POA: Diagnosis not present

## 2016-09-16 DIAGNOSIS — M545 Low back pain: Secondary | ICD-10-CM | POA: Diagnosis not present

## 2016-09-16 DIAGNOSIS — G3184 Mild cognitive impairment, so stated: Secondary | ICD-10-CM | POA: Diagnosis not present

## 2016-09-16 DIAGNOSIS — G8929 Other chronic pain: Secondary | ICD-10-CM | POA: Diagnosis not present

## 2016-09-16 DIAGNOSIS — I10 Essential (primary) hypertension: Secondary | ICD-10-CM | POA: Diagnosis not present

## 2016-09-16 DIAGNOSIS — M19021 Primary osteoarthritis, right elbow: Secondary | ICD-10-CM | POA: Diagnosis not present

## 2016-09-16 DIAGNOSIS — R2689 Other abnormalities of gait and mobility: Secondary | ICD-10-CM | POA: Diagnosis not present

## 2016-09-20 DIAGNOSIS — M19021 Primary osteoarthritis, right elbow: Secondary | ICD-10-CM | POA: Diagnosis not present

## 2016-09-20 DIAGNOSIS — G8929 Other chronic pain: Secondary | ICD-10-CM | POA: Diagnosis not present

## 2016-09-20 DIAGNOSIS — I10 Essential (primary) hypertension: Secondary | ICD-10-CM | POA: Diagnosis not present

## 2016-09-20 DIAGNOSIS — R2689 Other abnormalities of gait and mobility: Secondary | ICD-10-CM | POA: Diagnosis not present

## 2016-09-20 DIAGNOSIS — M545 Low back pain: Secondary | ICD-10-CM | POA: Diagnosis not present

## 2016-09-20 DIAGNOSIS — G3184 Mild cognitive impairment, so stated: Secondary | ICD-10-CM | POA: Diagnosis not present

## 2016-09-21 DIAGNOSIS — Z96649 Presence of unspecified artificial hip joint: Secondary | ICD-10-CM | POA: Diagnosis not present

## 2016-09-21 DIAGNOSIS — Q659 Congenital deformity of hip, unspecified: Secondary | ICD-10-CM | POA: Diagnosis not present

## 2016-09-21 DIAGNOSIS — R269 Unspecified abnormalities of gait and mobility: Secondary | ICD-10-CM | POA: Diagnosis not present

## 2016-09-21 DIAGNOSIS — L0390 Cellulitis, unspecified: Secondary | ICD-10-CM | POA: Diagnosis not present

## 2016-09-21 DIAGNOSIS — S0010XA Contusion of unspecified eyelid and periocular area, initial encounter: Secondary | ICD-10-CM | POA: Diagnosis not present

## 2016-09-21 DIAGNOSIS — I10 Essential (primary) hypertension: Secondary | ICD-10-CM | POA: Diagnosis not present

## 2016-09-21 DIAGNOSIS — G309 Alzheimer's disease, unspecified: Secondary | ICD-10-CM | POA: Diagnosis not present

## 2016-09-21 DIAGNOSIS — E785 Hyperlipidemia, unspecified: Secondary | ICD-10-CM | POA: Diagnosis not present

## 2016-09-21 DIAGNOSIS — W19XXXS Unspecified fall, sequela: Secondary | ICD-10-CM | POA: Diagnosis not present

## 2016-09-23 DIAGNOSIS — M545 Low back pain: Secondary | ICD-10-CM | POA: Diagnosis not present

## 2016-09-23 DIAGNOSIS — R2689 Other abnormalities of gait and mobility: Secondary | ICD-10-CM | POA: Diagnosis not present

## 2016-09-23 DIAGNOSIS — G8929 Other chronic pain: Secondary | ICD-10-CM | POA: Diagnosis not present

## 2016-09-23 DIAGNOSIS — G3184 Mild cognitive impairment, so stated: Secondary | ICD-10-CM | POA: Diagnosis not present

## 2016-09-23 DIAGNOSIS — M19021 Primary osteoarthritis, right elbow: Secondary | ICD-10-CM | POA: Diagnosis not present

## 2016-09-23 DIAGNOSIS — I10 Essential (primary) hypertension: Secondary | ICD-10-CM | POA: Diagnosis not present

## 2016-09-25 DIAGNOSIS — G8929 Other chronic pain: Secondary | ICD-10-CM | POA: Diagnosis not present

## 2016-09-25 DIAGNOSIS — G309 Alzheimer's disease, unspecified: Secondary | ICD-10-CM | POA: Diagnosis not present

## 2016-09-25 DIAGNOSIS — E119 Type 2 diabetes mellitus without complications: Secondary | ICD-10-CM | POA: Diagnosis not present

## 2016-09-25 DIAGNOSIS — R54 Age-related physical debility: Secondary | ICD-10-CM | POA: Diagnosis not present

## 2016-09-25 DIAGNOSIS — I10 Essential (primary) hypertension: Secondary | ICD-10-CM | POA: Diagnosis not present

## 2016-09-25 DIAGNOSIS — Z79899 Other long term (current) drug therapy: Secondary | ICD-10-CM | POA: Diagnosis not present

## 2016-09-25 DIAGNOSIS — K219 Gastro-esophageal reflux disease without esophagitis: Secondary | ICD-10-CM | POA: Diagnosis not present

## 2016-09-27 DIAGNOSIS — G8929 Other chronic pain: Secondary | ICD-10-CM | POA: Diagnosis not present

## 2016-09-27 DIAGNOSIS — G3184 Mild cognitive impairment, so stated: Secondary | ICD-10-CM | POA: Diagnosis not present

## 2016-09-27 DIAGNOSIS — R2689 Other abnormalities of gait and mobility: Secondary | ICD-10-CM | POA: Diagnosis not present

## 2016-09-27 DIAGNOSIS — M545 Low back pain: Secondary | ICD-10-CM | POA: Diagnosis not present

## 2016-09-27 DIAGNOSIS — M19021 Primary osteoarthritis, right elbow: Secondary | ICD-10-CM | POA: Diagnosis not present

## 2016-09-27 DIAGNOSIS — I10 Essential (primary) hypertension: Secondary | ICD-10-CM | POA: Diagnosis not present

## 2016-09-29 DIAGNOSIS — R2689 Other abnormalities of gait and mobility: Secondary | ICD-10-CM | POA: Diagnosis not present

## 2016-09-29 DIAGNOSIS — Z23 Encounter for immunization: Secondary | ICD-10-CM | POA: Diagnosis not present

## 2016-09-29 DIAGNOSIS — G3184 Mild cognitive impairment, so stated: Secondary | ICD-10-CM | POA: Diagnosis not present

## 2016-09-29 DIAGNOSIS — G8929 Other chronic pain: Secondary | ICD-10-CM | POA: Diagnosis not present

## 2016-09-29 DIAGNOSIS — M545 Low back pain: Secondary | ICD-10-CM | POA: Diagnosis not present

## 2016-09-29 DIAGNOSIS — I10 Essential (primary) hypertension: Secondary | ICD-10-CM | POA: Diagnosis not present

## 2016-09-29 DIAGNOSIS — M19021 Primary osteoarthritis, right elbow: Secondary | ICD-10-CM | POA: Diagnosis not present

## 2016-10-04 DIAGNOSIS — M19021 Primary osteoarthritis, right elbow: Secondary | ICD-10-CM | POA: Diagnosis not present

## 2016-10-04 DIAGNOSIS — R2689 Other abnormalities of gait and mobility: Secondary | ICD-10-CM | POA: Diagnosis not present

## 2016-10-04 DIAGNOSIS — G3184 Mild cognitive impairment, so stated: Secondary | ICD-10-CM | POA: Diagnosis not present

## 2016-10-04 DIAGNOSIS — I10 Essential (primary) hypertension: Secondary | ICD-10-CM | POA: Diagnosis not present

## 2016-10-04 DIAGNOSIS — M545 Low back pain: Secondary | ICD-10-CM | POA: Diagnosis not present

## 2016-10-04 DIAGNOSIS — G8929 Other chronic pain: Secondary | ICD-10-CM | POA: Diagnosis not present

## 2016-10-06 DIAGNOSIS — I10 Essential (primary) hypertension: Secondary | ICD-10-CM | POA: Diagnosis not present

## 2016-10-06 DIAGNOSIS — R2689 Other abnormalities of gait and mobility: Secondary | ICD-10-CM | POA: Diagnosis not present

## 2016-10-06 DIAGNOSIS — M19021 Primary osteoarthritis, right elbow: Secondary | ICD-10-CM | POA: Diagnosis not present

## 2016-10-06 DIAGNOSIS — G3184 Mild cognitive impairment, so stated: Secondary | ICD-10-CM | POA: Diagnosis not present

## 2016-10-06 DIAGNOSIS — G8929 Other chronic pain: Secondary | ICD-10-CM | POA: Diagnosis not present

## 2016-10-06 DIAGNOSIS — M545 Low back pain: Secondary | ICD-10-CM | POA: Diagnosis not present

## 2016-10-22 DIAGNOSIS — L0390 Cellulitis, unspecified: Secondary | ICD-10-CM | POA: Diagnosis not present

## 2016-10-22 DIAGNOSIS — Z96649 Presence of unspecified artificial hip joint: Secondary | ICD-10-CM | POA: Diagnosis not present

## 2016-10-22 DIAGNOSIS — Q659 Congenital deformity of hip, unspecified: Secondary | ICD-10-CM | POA: Diagnosis not present

## 2016-10-22 DIAGNOSIS — E785 Hyperlipidemia, unspecified: Secondary | ICD-10-CM | POA: Diagnosis not present

## 2016-11-01 DIAGNOSIS — R262 Difficulty in walking, not elsewhere classified: Secondary | ICD-10-CM | POA: Diagnosis not present

## 2016-11-01 DIAGNOSIS — L600 Ingrowing nail: Secondary | ICD-10-CM | POA: Diagnosis not present

## 2016-11-01 DIAGNOSIS — M79673 Pain in unspecified foot: Secondary | ICD-10-CM | POA: Diagnosis not present

## 2016-11-01 DIAGNOSIS — B351 Tinea unguium: Secondary | ICD-10-CM | POA: Diagnosis not present

## 2016-11-03 DIAGNOSIS — E119 Type 2 diabetes mellitus without complications: Secondary | ICD-10-CM | POA: Diagnosis not present

## 2016-11-03 DIAGNOSIS — Z79899 Other long term (current) drug therapy: Secondary | ICD-10-CM | POA: Diagnosis not present

## 2016-11-16 DIAGNOSIS — G4489 Other headache syndrome: Secondary | ICD-10-CM | POA: Diagnosis not present

## 2016-11-16 DIAGNOSIS — G309 Alzheimer's disease, unspecified: Secondary | ICD-10-CM | POA: Diagnosis not present

## 2016-11-16 DIAGNOSIS — R269 Unspecified abnormalities of gait and mobility: Secondary | ICD-10-CM | POA: Diagnosis not present

## 2016-11-16 DIAGNOSIS — I129 Hypertensive chronic kidney disease with stage 1 through stage 4 chronic kidney disease, or unspecified chronic kidney disease: Secondary | ICD-10-CM | POA: Diagnosis not present

## 2016-11-16 DIAGNOSIS — E119 Type 2 diabetes mellitus without complications: Secondary | ICD-10-CM | POA: Diagnosis not present

## 2016-11-16 DIAGNOSIS — I10 Essential (primary) hypertension: Secondary | ICD-10-CM | POA: Diagnosis not present

## 2016-11-21 DIAGNOSIS — Q659 Congenital deformity of hip, unspecified: Secondary | ICD-10-CM | POA: Diagnosis not present

## 2016-11-21 DIAGNOSIS — Z96649 Presence of unspecified artificial hip joint: Secondary | ICD-10-CM | POA: Diagnosis not present

## 2016-11-21 DIAGNOSIS — L0390 Cellulitis, unspecified: Secondary | ICD-10-CM | POA: Diagnosis not present

## 2016-11-21 DIAGNOSIS — E785 Hyperlipidemia, unspecified: Secondary | ICD-10-CM | POA: Diagnosis not present

## 2016-11-23 DIAGNOSIS — J40 Bronchitis, not specified as acute or chronic: Secondary | ICD-10-CM | POA: Diagnosis not present

## 2016-11-23 DIAGNOSIS — R54 Age-related physical debility: Secondary | ICD-10-CM | POA: Diagnosis not present

## 2016-11-23 DIAGNOSIS — I10 Essential (primary) hypertension: Secondary | ICD-10-CM | POA: Diagnosis not present

## 2016-11-23 DIAGNOSIS — G309 Alzheimer's disease, unspecified: Secondary | ICD-10-CM | POA: Diagnosis not present

## 2016-12-02 DIAGNOSIS — Z79899 Other long term (current) drug therapy: Secondary | ICD-10-CM | POA: Diagnosis not present

## 2016-12-02 DIAGNOSIS — R69 Illness, unspecified: Secondary | ICD-10-CM | POA: Diagnosis not present

## 2016-12-02 DIAGNOSIS — R05 Cough: Secondary | ICD-10-CM | POA: Diagnosis not present

## 2016-12-02 DIAGNOSIS — J40 Bronchitis, not specified as acute or chronic: Secondary | ICD-10-CM | POA: Diagnosis not present

## 2016-12-02 DIAGNOSIS — I10 Essential (primary) hypertension: Secondary | ICD-10-CM | POA: Diagnosis not present

## 2016-12-02 DIAGNOSIS — R54 Age-related physical debility: Secondary | ICD-10-CM | POA: Diagnosis not present

## 2016-12-02 DIAGNOSIS — G309 Alzheimer's disease, unspecified: Secondary | ICD-10-CM | POA: Diagnosis not present

## 2016-12-09 DIAGNOSIS — J40 Bronchitis, not specified as acute or chronic: Secondary | ICD-10-CM | POA: Diagnosis not present

## 2016-12-09 DIAGNOSIS — I10 Essential (primary) hypertension: Secondary | ICD-10-CM | POA: Diagnosis not present

## 2016-12-09 DIAGNOSIS — Z79899 Other long term (current) drug therapy: Secondary | ICD-10-CM | POA: Diagnosis not present

## 2016-12-09 DIAGNOSIS — G309 Alzheimer's disease, unspecified: Secondary | ICD-10-CM | POA: Diagnosis not present

## 2016-12-09 DIAGNOSIS — R05 Cough: Secondary | ICD-10-CM | POA: Diagnosis not present

## 2016-12-09 DIAGNOSIS — R54 Age-related physical debility: Secondary | ICD-10-CM | POA: Diagnosis not present

## 2016-12-11 DIAGNOSIS — K219 Gastro-esophageal reflux disease without esophagitis: Secondary | ICD-10-CM | POA: Diagnosis not present

## 2016-12-11 DIAGNOSIS — E119 Type 2 diabetes mellitus without complications: Secondary | ICD-10-CM | POA: Diagnosis not present

## 2016-12-11 DIAGNOSIS — R54 Age-related physical debility: Secondary | ICD-10-CM | POA: Diagnosis not present

## 2016-12-11 DIAGNOSIS — I10 Essential (primary) hypertension: Secondary | ICD-10-CM | POA: Diagnosis not present

## 2016-12-11 DIAGNOSIS — Z79899 Other long term (current) drug therapy: Secondary | ICD-10-CM | POA: Diagnosis not present

## 2016-12-11 DIAGNOSIS — G309 Alzheimer's disease, unspecified: Secondary | ICD-10-CM | POA: Diagnosis not present

## 2016-12-21 DIAGNOSIS — J40 Bronchitis, not specified as acute or chronic: Secondary | ICD-10-CM | POA: Diagnosis not present

## 2016-12-21 DIAGNOSIS — I10 Essential (primary) hypertension: Secondary | ICD-10-CM | POA: Diagnosis not present

## 2016-12-21 DIAGNOSIS — N393 Stress incontinence (female) (male): Secondary | ICD-10-CM | POA: Diagnosis not present

## 2016-12-21 DIAGNOSIS — G309 Alzheimer's disease, unspecified: Secondary | ICD-10-CM | POA: Diagnosis not present

## 2016-12-21 DIAGNOSIS — R54 Age-related physical debility: Secondary | ICD-10-CM | POA: Diagnosis not present

## 2016-12-21 DIAGNOSIS — R062 Wheezing: Secondary | ICD-10-CM | POA: Diagnosis not present

## 2016-12-22 DIAGNOSIS — L0390 Cellulitis, unspecified: Secondary | ICD-10-CM | POA: Diagnosis not present

## 2016-12-22 DIAGNOSIS — Q659 Congenital deformity of hip, unspecified: Secondary | ICD-10-CM | POA: Diagnosis not present

## 2016-12-22 DIAGNOSIS — Z96649 Presence of unspecified artificial hip joint: Secondary | ICD-10-CM | POA: Diagnosis not present

## 2016-12-22 DIAGNOSIS — E785 Hyperlipidemia, unspecified: Secondary | ICD-10-CM | POA: Diagnosis not present

## 2016-12-23 DIAGNOSIS — R54 Age-related physical debility: Secondary | ICD-10-CM | POA: Diagnosis not present

## 2016-12-23 DIAGNOSIS — Z79899 Other long term (current) drug therapy: Secondary | ICD-10-CM | POA: Diagnosis not present

## 2016-12-23 DIAGNOSIS — G309 Alzheimer's disease, unspecified: Secondary | ICD-10-CM | POA: Diagnosis not present

## 2016-12-23 DIAGNOSIS — I10 Essential (primary) hypertension: Secondary | ICD-10-CM | POA: Diagnosis not present

## 2016-12-23 DIAGNOSIS — R05 Cough: Secondary | ICD-10-CM | POA: Diagnosis not present

## 2016-12-23 DIAGNOSIS — R269 Unspecified abnormalities of gait and mobility: Secondary | ICD-10-CM | POA: Diagnosis not present

## 2016-12-23 DIAGNOSIS — R062 Wheezing: Secondary | ICD-10-CM | POA: Diagnosis not present

## 2016-12-23 DIAGNOSIS — M6281 Muscle weakness (generalized): Secondary | ICD-10-CM | POA: Diagnosis not present

## 2016-12-23 DIAGNOSIS — R0602 Shortness of breath: Secondary | ICD-10-CM | POA: Diagnosis not present

## 2016-12-24 DIAGNOSIS — R69 Illness, unspecified: Secondary | ICD-10-CM | POA: Diagnosis not present

## 2016-12-27 DIAGNOSIS — N189 Chronic kidney disease, unspecified: Secondary | ICD-10-CM | POA: Diagnosis not present

## 2016-12-27 DIAGNOSIS — I129 Hypertensive chronic kidney disease with stage 1 through stage 4 chronic kidney disease, or unspecified chronic kidney disease: Secondary | ICD-10-CM | POA: Diagnosis not present

## 2016-12-27 DIAGNOSIS — M6281 Muscle weakness (generalized): Secondary | ICD-10-CM | POA: Diagnosis not present

## 2016-12-27 DIAGNOSIS — G309 Alzheimer's disease, unspecified: Secondary | ICD-10-CM | POA: Diagnosis not present

## 2016-12-27 DIAGNOSIS — E1122 Type 2 diabetes mellitus with diabetic chronic kidney disease: Secondary | ICD-10-CM | POA: Diagnosis not present

## 2016-12-27 DIAGNOSIS — R269 Unspecified abnormalities of gait and mobility: Secondary | ICD-10-CM | POA: Diagnosis not present

## 2016-12-27 DIAGNOSIS — R69 Illness, unspecified: Secondary | ICD-10-CM | POA: Diagnosis not present

## 2016-12-27 DIAGNOSIS — M1990 Unspecified osteoarthritis, unspecified site: Secondary | ICD-10-CM | POA: Diagnosis not present

## 2016-12-31 DIAGNOSIS — R062 Wheezing: Secondary | ICD-10-CM | POA: Diagnosis not present

## 2016-12-31 DIAGNOSIS — R05 Cough: Secondary | ICD-10-CM | POA: Diagnosis not present

## 2017-01-03 DIAGNOSIS — L600 Ingrowing nail: Secondary | ICD-10-CM | POA: Diagnosis not present

## 2017-01-03 DIAGNOSIS — G309 Alzheimer's disease, unspecified: Secondary | ICD-10-CM | POA: Diagnosis not present

## 2017-01-03 DIAGNOSIS — R54 Age-related physical debility: Secondary | ICD-10-CM | POA: Diagnosis not present

## 2017-01-03 DIAGNOSIS — E119 Type 2 diabetes mellitus without complications: Secondary | ICD-10-CM | POA: Diagnosis not present

## 2017-01-03 DIAGNOSIS — M6281 Muscle weakness (generalized): Secondary | ICD-10-CM | POA: Diagnosis not present

## 2017-01-03 DIAGNOSIS — R69 Illness, unspecified: Secondary | ICD-10-CM | POA: Diagnosis not present

## 2017-01-03 DIAGNOSIS — K219 Gastro-esophageal reflux disease without esophagitis: Secondary | ICD-10-CM | POA: Diagnosis not present

## 2017-01-03 DIAGNOSIS — B351 Tinea unguium: Secondary | ICD-10-CM | POA: Diagnosis not present

## 2017-01-03 DIAGNOSIS — R262 Difficulty in walking, not elsewhere classified: Secondary | ICD-10-CM | POA: Diagnosis not present

## 2017-01-03 DIAGNOSIS — I129 Hypertensive chronic kidney disease with stage 1 through stage 4 chronic kidney disease, or unspecified chronic kidney disease: Secondary | ICD-10-CM | POA: Diagnosis not present

## 2017-01-03 DIAGNOSIS — Z9181 History of falling: Secondary | ICD-10-CM | POA: Diagnosis not present

## 2017-01-03 DIAGNOSIS — I10 Essential (primary) hypertension: Secondary | ICD-10-CM | POA: Diagnosis not present

## 2017-01-03 DIAGNOSIS — M1990 Unspecified osteoarthritis, unspecified site: Secondary | ICD-10-CM | POA: Diagnosis not present

## 2017-01-03 DIAGNOSIS — M79673 Pain in unspecified foot: Secondary | ICD-10-CM | POA: Diagnosis not present

## 2017-01-04 DIAGNOSIS — M6281 Muscle weakness (generalized): Secondary | ICD-10-CM | POA: Diagnosis not present

## 2017-01-04 DIAGNOSIS — K449 Diaphragmatic hernia without obstruction or gangrene: Secondary | ICD-10-CM | POA: Diagnosis not present

## 2017-01-04 DIAGNOSIS — R269 Unspecified abnormalities of gait and mobility: Secondary | ICD-10-CM | POA: Diagnosis not present

## 2017-01-04 DIAGNOSIS — Z9181 History of falling: Secondary | ICD-10-CM | POA: Diagnosis not present

## 2017-01-04 DIAGNOSIS — R69 Illness, unspecified: Secondary | ICD-10-CM | POA: Diagnosis not present

## 2017-01-04 DIAGNOSIS — M1990 Unspecified osteoarthritis, unspecified site: Secondary | ICD-10-CM | POA: Diagnosis not present

## 2017-01-04 DIAGNOSIS — G309 Alzheimer's disease, unspecified: Secondary | ICD-10-CM | POA: Diagnosis not present

## 2017-01-04 DIAGNOSIS — I129 Hypertensive chronic kidney disease with stage 1 through stage 4 chronic kidney disease, or unspecified chronic kidney disease: Secondary | ICD-10-CM | POA: Diagnosis not present

## 2017-01-04 DIAGNOSIS — N393 Stress incontinence (female) (male): Secondary | ICD-10-CM | POA: Diagnosis not present

## 2017-01-04 DIAGNOSIS — R54 Age-related physical debility: Secondary | ICD-10-CM | POA: Diagnosis not present

## 2017-01-04 DIAGNOSIS — I10 Essential (primary) hypertension: Secondary | ICD-10-CM | POA: Diagnosis not present

## 2017-01-06 DIAGNOSIS — M1990 Unspecified osteoarthritis, unspecified site: Secondary | ICD-10-CM | POA: Diagnosis not present

## 2017-01-06 DIAGNOSIS — I129 Hypertensive chronic kidney disease with stage 1 through stage 4 chronic kidney disease, or unspecified chronic kidney disease: Secondary | ICD-10-CM | POA: Diagnosis not present

## 2017-01-06 DIAGNOSIS — M6281 Muscle weakness (generalized): Secondary | ICD-10-CM | POA: Diagnosis not present

## 2017-01-06 DIAGNOSIS — R69 Illness, unspecified: Secondary | ICD-10-CM | POA: Diagnosis not present

## 2017-01-06 DIAGNOSIS — Z9181 History of falling: Secondary | ICD-10-CM | POA: Diagnosis not present

## 2017-01-06 DIAGNOSIS — G309 Alzheimer's disease, unspecified: Secondary | ICD-10-CM | POA: Diagnosis not present

## 2017-01-10 DIAGNOSIS — Z9181 History of falling: Secondary | ICD-10-CM | POA: Diagnosis not present

## 2017-01-10 DIAGNOSIS — M6281 Muscle weakness (generalized): Secondary | ICD-10-CM | POA: Diagnosis not present

## 2017-01-10 DIAGNOSIS — I129 Hypertensive chronic kidney disease with stage 1 through stage 4 chronic kidney disease, or unspecified chronic kidney disease: Secondary | ICD-10-CM | POA: Diagnosis not present

## 2017-01-10 DIAGNOSIS — G309 Alzheimer's disease, unspecified: Secondary | ICD-10-CM | POA: Diagnosis not present

## 2017-01-10 DIAGNOSIS — M1990 Unspecified osteoarthritis, unspecified site: Secondary | ICD-10-CM | POA: Diagnosis not present

## 2017-01-10 DIAGNOSIS — R69 Illness, unspecified: Secondary | ICD-10-CM | POA: Diagnosis not present

## 2017-01-12 DIAGNOSIS — I129 Hypertensive chronic kidney disease with stage 1 through stage 4 chronic kidney disease, or unspecified chronic kidney disease: Secondary | ICD-10-CM | POA: Diagnosis not present

## 2017-01-12 DIAGNOSIS — M1990 Unspecified osteoarthritis, unspecified site: Secondary | ICD-10-CM | POA: Diagnosis not present

## 2017-01-12 DIAGNOSIS — G309 Alzheimer's disease, unspecified: Secondary | ICD-10-CM | POA: Diagnosis not present

## 2017-01-12 DIAGNOSIS — Z9181 History of falling: Secondary | ICD-10-CM | POA: Diagnosis not present

## 2017-01-12 DIAGNOSIS — R69 Illness, unspecified: Secondary | ICD-10-CM | POA: Diagnosis not present

## 2017-01-12 DIAGNOSIS — M6281 Muscle weakness (generalized): Secondary | ICD-10-CM | POA: Diagnosis not present

## 2017-01-14 DIAGNOSIS — M6281 Muscle weakness (generalized): Secondary | ICD-10-CM | POA: Diagnosis not present

## 2017-01-14 DIAGNOSIS — Z9181 History of falling: Secondary | ICD-10-CM | POA: Diagnosis not present

## 2017-01-14 DIAGNOSIS — M1990 Unspecified osteoarthritis, unspecified site: Secondary | ICD-10-CM | POA: Diagnosis not present

## 2017-01-14 DIAGNOSIS — G309 Alzheimer's disease, unspecified: Secondary | ICD-10-CM | POA: Diagnosis not present

## 2017-01-14 DIAGNOSIS — I129 Hypertensive chronic kidney disease with stage 1 through stage 4 chronic kidney disease, or unspecified chronic kidney disease: Secondary | ICD-10-CM | POA: Diagnosis not present

## 2017-01-14 DIAGNOSIS — R69 Illness, unspecified: Secondary | ICD-10-CM | POA: Diagnosis not present

## 2017-01-19 DIAGNOSIS — M6281 Muscle weakness (generalized): Secondary | ICD-10-CM | POA: Diagnosis not present

## 2017-01-19 DIAGNOSIS — M1990 Unspecified osteoarthritis, unspecified site: Secondary | ICD-10-CM | POA: Diagnosis not present

## 2017-01-19 DIAGNOSIS — Z9181 History of falling: Secondary | ICD-10-CM | POA: Diagnosis not present

## 2017-01-19 DIAGNOSIS — I129 Hypertensive chronic kidney disease with stage 1 through stage 4 chronic kidney disease, or unspecified chronic kidney disease: Secondary | ICD-10-CM | POA: Diagnosis not present

## 2017-01-19 DIAGNOSIS — G309 Alzheimer's disease, unspecified: Secondary | ICD-10-CM | POA: Diagnosis not present

## 2017-01-19 DIAGNOSIS — R69 Illness, unspecified: Secondary | ICD-10-CM | POA: Diagnosis not present

## 2017-01-20 DIAGNOSIS — I129 Hypertensive chronic kidney disease with stage 1 through stage 4 chronic kidney disease, or unspecified chronic kidney disease: Secondary | ICD-10-CM | POA: Diagnosis not present

## 2017-01-20 DIAGNOSIS — G309 Alzheimer's disease, unspecified: Secondary | ICD-10-CM | POA: Diagnosis not present

## 2017-01-20 DIAGNOSIS — M6281 Muscle weakness (generalized): Secondary | ICD-10-CM | POA: Diagnosis not present

## 2017-01-20 DIAGNOSIS — Z9181 History of falling: Secondary | ICD-10-CM | POA: Diagnosis not present

## 2017-01-20 DIAGNOSIS — M1990 Unspecified osteoarthritis, unspecified site: Secondary | ICD-10-CM | POA: Diagnosis not present

## 2017-01-20 DIAGNOSIS — R69 Illness, unspecified: Secondary | ICD-10-CM | POA: Diagnosis not present

## 2017-01-21 DIAGNOSIS — I129 Hypertensive chronic kidney disease with stage 1 through stage 4 chronic kidney disease, or unspecified chronic kidney disease: Secondary | ICD-10-CM | POA: Diagnosis not present

## 2017-01-21 DIAGNOSIS — M6281 Muscle weakness (generalized): Secondary | ICD-10-CM | POA: Diagnosis not present

## 2017-01-21 DIAGNOSIS — R69 Illness, unspecified: Secondary | ICD-10-CM | POA: Diagnosis not present

## 2017-01-21 DIAGNOSIS — M1990 Unspecified osteoarthritis, unspecified site: Secondary | ICD-10-CM | POA: Diagnosis not present

## 2017-01-21 DIAGNOSIS — G309 Alzheimer's disease, unspecified: Secondary | ICD-10-CM | POA: Diagnosis not present

## 2017-01-21 DIAGNOSIS — Z9181 History of falling: Secondary | ICD-10-CM | POA: Diagnosis not present

## 2017-01-22 DIAGNOSIS — L0390 Cellulitis, unspecified: Secondary | ICD-10-CM | POA: Diagnosis not present

## 2017-01-22 DIAGNOSIS — Q659 Congenital deformity of hip, unspecified: Secondary | ICD-10-CM | POA: Diagnosis not present

## 2017-01-22 DIAGNOSIS — E785 Hyperlipidemia, unspecified: Secondary | ICD-10-CM | POA: Diagnosis not present

## 2017-01-22 DIAGNOSIS — Z96649 Presence of unspecified artificial hip joint: Secondary | ICD-10-CM | POA: Diagnosis not present

## 2017-01-24 DIAGNOSIS — M1990 Unspecified osteoarthritis, unspecified site: Secondary | ICD-10-CM | POA: Diagnosis not present

## 2017-01-24 DIAGNOSIS — G309 Alzheimer's disease, unspecified: Secondary | ICD-10-CM | POA: Diagnosis not present

## 2017-01-24 DIAGNOSIS — Z9181 History of falling: Secondary | ICD-10-CM | POA: Diagnosis not present

## 2017-01-24 DIAGNOSIS — I129 Hypertensive chronic kidney disease with stage 1 through stage 4 chronic kidney disease, or unspecified chronic kidney disease: Secondary | ICD-10-CM | POA: Diagnosis not present

## 2017-01-24 DIAGNOSIS — M6281 Muscle weakness (generalized): Secondary | ICD-10-CM | POA: Diagnosis not present

## 2017-01-24 DIAGNOSIS — R69 Illness, unspecified: Secondary | ICD-10-CM | POA: Diagnosis not present

## 2017-01-25 DIAGNOSIS — R54 Age-related physical debility: Secondary | ICD-10-CM | POA: Diagnosis not present

## 2017-01-25 DIAGNOSIS — R269 Unspecified abnormalities of gait and mobility: Secondary | ICD-10-CM | POA: Diagnosis not present

## 2017-01-25 DIAGNOSIS — I10 Essential (primary) hypertension: Secondary | ICD-10-CM | POA: Diagnosis not present

## 2017-01-25 DIAGNOSIS — G309 Alzheimer's disease, unspecified: Secondary | ICD-10-CM | POA: Diagnosis not present

## 2017-01-25 DIAGNOSIS — N189 Chronic kidney disease, unspecified: Secondary | ICD-10-CM | POA: Diagnosis not present

## 2017-01-25 DIAGNOSIS — M546 Pain in thoracic spine: Secondary | ICD-10-CM | POA: Diagnosis not present

## 2017-01-27 DIAGNOSIS — M1990 Unspecified osteoarthritis, unspecified site: Secondary | ICD-10-CM | POA: Diagnosis not present

## 2017-01-27 DIAGNOSIS — Z9181 History of falling: Secondary | ICD-10-CM | POA: Diagnosis not present

## 2017-01-27 DIAGNOSIS — G309 Alzheimer's disease, unspecified: Secondary | ICD-10-CM | POA: Diagnosis not present

## 2017-01-27 DIAGNOSIS — I129 Hypertensive chronic kidney disease with stage 1 through stage 4 chronic kidney disease, or unspecified chronic kidney disease: Secondary | ICD-10-CM | POA: Diagnosis not present

## 2017-01-27 DIAGNOSIS — R69 Illness, unspecified: Secondary | ICD-10-CM | POA: Diagnosis not present

## 2017-01-27 DIAGNOSIS — M6281 Muscle weakness (generalized): Secondary | ICD-10-CM | POA: Diagnosis not present

## 2017-01-28 DIAGNOSIS — M6281 Muscle weakness (generalized): Secondary | ICD-10-CM | POA: Diagnosis not present

## 2017-01-28 DIAGNOSIS — R69 Illness, unspecified: Secondary | ICD-10-CM | POA: Diagnosis not present

## 2017-01-28 DIAGNOSIS — Z9181 History of falling: Secondary | ICD-10-CM | POA: Diagnosis not present

## 2017-01-28 DIAGNOSIS — G309 Alzheimer's disease, unspecified: Secondary | ICD-10-CM | POA: Diagnosis not present

## 2017-01-28 DIAGNOSIS — M1990 Unspecified osteoarthritis, unspecified site: Secondary | ICD-10-CM | POA: Diagnosis not present

## 2017-01-28 DIAGNOSIS — I129 Hypertensive chronic kidney disease with stage 1 through stage 4 chronic kidney disease, or unspecified chronic kidney disease: Secondary | ICD-10-CM | POA: Diagnosis not present

## 2017-02-01 DIAGNOSIS — I129 Hypertensive chronic kidney disease with stage 1 through stage 4 chronic kidney disease, or unspecified chronic kidney disease: Secondary | ICD-10-CM | POA: Diagnosis not present

## 2017-02-01 DIAGNOSIS — R69 Illness, unspecified: Secondary | ICD-10-CM | POA: Diagnosis not present

## 2017-02-01 DIAGNOSIS — Z9181 History of falling: Secondary | ICD-10-CM | POA: Diagnosis not present

## 2017-02-01 DIAGNOSIS — G309 Alzheimer's disease, unspecified: Secondary | ICD-10-CM | POA: Diagnosis not present

## 2017-02-01 DIAGNOSIS — M1990 Unspecified osteoarthritis, unspecified site: Secondary | ICD-10-CM | POA: Diagnosis not present

## 2017-02-01 DIAGNOSIS — M6281 Muscle weakness (generalized): Secondary | ICD-10-CM | POA: Diagnosis not present

## 2017-02-03 DIAGNOSIS — I129 Hypertensive chronic kidney disease with stage 1 through stage 4 chronic kidney disease, or unspecified chronic kidney disease: Secondary | ICD-10-CM | POA: Diagnosis not present

## 2017-02-03 DIAGNOSIS — M1990 Unspecified osteoarthritis, unspecified site: Secondary | ICD-10-CM | POA: Diagnosis not present

## 2017-02-03 DIAGNOSIS — M6281 Muscle weakness (generalized): Secondary | ICD-10-CM | POA: Diagnosis not present

## 2017-02-03 DIAGNOSIS — R69 Illness, unspecified: Secondary | ICD-10-CM | POA: Diagnosis not present

## 2017-02-03 DIAGNOSIS — Z9181 History of falling: Secondary | ICD-10-CM | POA: Diagnosis not present

## 2017-02-03 DIAGNOSIS — G309 Alzheimer's disease, unspecified: Secondary | ICD-10-CM | POA: Diagnosis not present

## 2017-02-08 DIAGNOSIS — G309 Alzheimer's disease, unspecified: Secondary | ICD-10-CM | POA: Diagnosis not present

## 2017-02-08 DIAGNOSIS — M6281 Muscle weakness (generalized): Secondary | ICD-10-CM | POA: Diagnosis not present

## 2017-02-08 DIAGNOSIS — R69 Illness, unspecified: Secondary | ICD-10-CM | POA: Diagnosis not present

## 2017-02-08 DIAGNOSIS — I129 Hypertensive chronic kidney disease with stage 1 through stage 4 chronic kidney disease, or unspecified chronic kidney disease: Secondary | ICD-10-CM | POA: Diagnosis not present

## 2017-02-08 DIAGNOSIS — Z9181 History of falling: Secondary | ICD-10-CM | POA: Diagnosis not present

## 2017-02-08 DIAGNOSIS — M1990 Unspecified osteoarthritis, unspecified site: Secondary | ICD-10-CM | POA: Diagnosis not present

## 2017-02-10 DIAGNOSIS — G309 Alzheimer's disease, unspecified: Secondary | ICD-10-CM | POA: Diagnosis not present

## 2017-02-10 DIAGNOSIS — R69 Illness, unspecified: Secondary | ICD-10-CM | POA: Diagnosis not present

## 2017-02-10 DIAGNOSIS — I10 Essential (primary) hypertension: Secondary | ICD-10-CM | POA: Diagnosis not present

## 2017-02-10 DIAGNOSIS — Z0000 Encounter for general adult medical examination without abnormal findings: Secondary | ICD-10-CM | POA: Diagnosis not present

## 2017-02-10 DIAGNOSIS — N189 Chronic kidney disease, unspecified: Secondary | ICD-10-CM | POA: Diagnosis not present

## 2017-02-11 DIAGNOSIS — M6281 Muscle weakness (generalized): Secondary | ICD-10-CM | POA: Diagnosis not present

## 2017-02-11 DIAGNOSIS — G309 Alzheimer's disease, unspecified: Secondary | ICD-10-CM | POA: Diagnosis not present

## 2017-02-11 DIAGNOSIS — I129 Hypertensive chronic kidney disease with stage 1 through stage 4 chronic kidney disease, or unspecified chronic kidney disease: Secondary | ICD-10-CM | POA: Diagnosis not present

## 2017-02-11 DIAGNOSIS — R69 Illness, unspecified: Secondary | ICD-10-CM | POA: Diagnosis not present

## 2017-02-11 DIAGNOSIS — M1990 Unspecified osteoarthritis, unspecified site: Secondary | ICD-10-CM | POA: Diagnosis not present

## 2017-02-11 DIAGNOSIS — Z9181 History of falling: Secondary | ICD-10-CM | POA: Diagnosis not present

## 2017-02-15 DIAGNOSIS — G309 Alzheimer's disease, unspecified: Secondary | ICD-10-CM | POA: Diagnosis not present

## 2017-02-15 DIAGNOSIS — I129 Hypertensive chronic kidney disease with stage 1 through stage 4 chronic kidney disease, or unspecified chronic kidney disease: Secondary | ICD-10-CM | POA: Diagnosis not present

## 2017-02-15 DIAGNOSIS — M6281 Muscle weakness (generalized): Secondary | ICD-10-CM | POA: Diagnosis not present

## 2017-02-15 DIAGNOSIS — Z9181 History of falling: Secondary | ICD-10-CM | POA: Diagnosis not present

## 2017-02-15 DIAGNOSIS — R69 Illness, unspecified: Secondary | ICD-10-CM | POA: Diagnosis not present

## 2017-02-15 DIAGNOSIS — M1990 Unspecified osteoarthritis, unspecified site: Secondary | ICD-10-CM | POA: Diagnosis not present

## 2017-02-17 DIAGNOSIS — G309 Alzheimer's disease, unspecified: Secondary | ICD-10-CM | POA: Diagnosis not present

## 2017-02-17 DIAGNOSIS — I129 Hypertensive chronic kidney disease with stage 1 through stage 4 chronic kidney disease, or unspecified chronic kidney disease: Secondary | ICD-10-CM | POA: Diagnosis not present

## 2017-02-17 DIAGNOSIS — Z9181 History of falling: Secondary | ICD-10-CM | POA: Diagnosis not present

## 2017-02-17 DIAGNOSIS — M1990 Unspecified osteoarthritis, unspecified site: Secondary | ICD-10-CM | POA: Diagnosis not present

## 2017-02-17 DIAGNOSIS — M6281 Muscle weakness (generalized): Secondary | ICD-10-CM | POA: Diagnosis not present

## 2017-02-17 DIAGNOSIS — R69 Illness, unspecified: Secondary | ICD-10-CM | POA: Diagnosis not present

## 2017-02-19 DIAGNOSIS — Q659 Congenital deformity of hip, unspecified: Secondary | ICD-10-CM | POA: Diagnosis not present

## 2017-02-19 DIAGNOSIS — L0390 Cellulitis, unspecified: Secondary | ICD-10-CM | POA: Diagnosis not present

## 2017-02-19 DIAGNOSIS — E785 Hyperlipidemia, unspecified: Secondary | ICD-10-CM | POA: Diagnosis not present

## 2017-02-19 DIAGNOSIS — Z96649 Presence of unspecified artificial hip joint: Secondary | ICD-10-CM | POA: Diagnosis not present

## 2017-02-22 DIAGNOSIS — N189 Chronic kidney disease, unspecified: Secondary | ICD-10-CM | POA: Diagnosis not present

## 2017-02-22 DIAGNOSIS — I10 Essential (primary) hypertension: Secondary | ICD-10-CM | POA: Diagnosis not present

## 2017-02-22 DIAGNOSIS — R3981 Functional urinary incontinence: Secondary | ICD-10-CM | POA: Diagnosis not present

## 2017-02-22 DIAGNOSIS — G309 Alzheimer's disease, unspecified: Secondary | ICD-10-CM | POA: Diagnosis not present

## 2017-02-22 DIAGNOSIS — R269 Unspecified abnormalities of gait and mobility: Secondary | ICD-10-CM | POA: Diagnosis not present

## 2017-02-22 DIAGNOSIS — R54 Age-related physical debility: Secondary | ICD-10-CM | POA: Diagnosis not present

## 2017-02-22 DIAGNOSIS — M546 Pain in thoracic spine: Secondary | ICD-10-CM | POA: Diagnosis not present

## 2017-02-23 DIAGNOSIS — R69 Illness, unspecified: Secondary | ICD-10-CM | POA: Diagnosis not present

## 2017-02-23 DIAGNOSIS — I129 Hypertensive chronic kidney disease with stage 1 through stage 4 chronic kidney disease, or unspecified chronic kidney disease: Secondary | ICD-10-CM | POA: Diagnosis not present

## 2017-02-23 DIAGNOSIS — M6281 Muscle weakness (generalized): Secondary | ICD-10-CM | POA: Diagnosis not present

## 2017-02-23 DIAGNOSIS — M1990 Unspecified osteoarthritis, unspecified site: Secondary | ICD-10-CM | POA: Diagnosis not present

## 2017-02-23 DIAGNOSIS — G309 Alzheimer's disease, unspecified: Secondary | ICD-10-CM | POA: Diagnosis not present

## 2017-02-23 DIAGNOSIS — Z9181 History of falling: Secondary | ICD-10-CM | POA: Diagnosis not present

## 2017-02-24 DIAGNOSIS — M6281 Muscle weakness (generalized): Secondary | ICD-10-CM | POA: Diagnosis not present

## 2017-02-24 DIAGNOSIS — I129 Hypertensive chronic kidney disease with stage 1 through stage 4 chronic kidney disease, or unspecified chronic kidney disease: Secondary | ICD-10-CM | POA: Diagnosis not present

## 2017-02-24 DIAGNOSIS — M1990 Unspecified osteoarthritis, unspecified site: Secondary | ICD-10-CM | POA: Diagnosis not present

## 2017-02-24 DIAGNOSIS — G309 Alzheimer's disease, unspecified: Secondary | ICD-10-CM | POA: Diagnosis not present

## 2017-02-24 DIAGNOSIS — R69 Illness, unspecified: Secondary | ICD-10-CM | POA: Diagnosis not present

## 2017-02-24 DIAGNOSIS — Z9181 History of falling: Secondary | ICD-10-CM | POA: Diagnosis not present

## 2017-02-28 DIAGNOSIS — B351 Tinea unguium: Secondary | ICD-10-CM | POA: Diagnosis not present

## 2017-02-28 DIAGNOSIS — M79673 Pain in unspecified foot: Secondary | ICD-10-CM | POA: Diagnosis not present

## 2017-02-28 DIAGNOSIS — L600 Ingrowing nail: Secondary | ICD-10-CM | POA: Diagnosis not present

## 2017-02-28 DIAGNOSIS — R262 Difficulty in walking, not elsewhere classified: Secondary | ICD-10-CM | POA: Diagnosis not present

## 2017-03-02 DIAGNOSIS — M6281 Muscle weakness (generalized): Secondary | ICD-10-CM | POA: Diagnosis not present

## 2017-03-02 DIAGNOSIS — M1990 Unspecified osteoarthritis, unspecified site: Secondary | ICD-10-CM | POA: Diagnosis not present

## 2017-03-08 DIAGNOSIS — Z79899 Other long term (current) drug therapy: Secondary | ICD-10-CM | POA: Diagnosis not present

## 2017-03-08 DIAGNOSIS — E119 Type 2 diabetes mellitus without complications: Secondary | ICD-10-CM | POA: Diagnosis not present

## 2017-03-15 DIAGNOSIS — R3981 Functional urinary incontinence: Secondary | ICD-10-CM | POA: Diagnosis not present

## 2017-03-15 DIAGNOSIS — R269 Unspecified abnormalities of gait and mobility: Secondary | ICD-10-CM | POA: Diagnosis not present

## 2017-03-15 DIAGNOSIS — I10 Essential (primary) hypertension: Secondary | ICD-10-CM | POA: Diagnosis not present

## 2017-03-15 DIAGNOSIS — R69 Illness, unspecified: Secondary | ICD-10-CM | POA: Diagnosis not present

## 2017-03-15 DIAGNOSIS — M546 Pain in thoracic spine: Secondary | ICD-10-CM | POA: Diagnosis not present

## 2017-03-15 DIAGNOSIS — R7309 Other abnormal glucose: Secondary | ICD-10-CM | POA: Diagnosis not present

## 2017-03-15 DIAGNOSIS — N189 Chronic kidney disease, unspecified: Secondary | ICD-10-CM | POA: Diagnosis not present

## 2017-03-15 DIAGNOSIS — R54 Age-related physical debility: Secondary | ICD-10-CM | POA: Diagnosis not present

## 2017-04-12 DIAGNOSIS — R269 Unspecified abnormalities of gait and mobility: Secondary | ICD-10-CM | POA: Diagnosis not present

## 2017-04-12 DIAGNOSIS — I10 Essential (primary) hypertension: Secondary | ICD-10-CM | POA: Diagnosis not present

## 2017-04-12 DIAGNOSIS — G309 Alzheimer's disease, unspecified: Secondary | ICD-10-CM | POA: Diagnosis not present

## 2017-04-12 DIAGNOSIS — J45909 Unspecified asthma, uncomplicated: Secondary | ICD-10-CM | POA: Diagnosis not present

## 2017-04-12 DIAGNOSIS — R54 Age-related physical debility: Secondary | ICD-10-CM | POA: Diagnosis not present

## 2017-04-12 DIAGNOSIS — M546 Pain in thoracic spine: Secondary | ICD-10-CM | POA: Diagnosis not present

## 2017-04-12 DIAGNOSIS — R609 Edema, unspecified: Secondary | ICD-10-CM | POA: Diagnosis not present

## 2017-04-26 DIAGNOSIS — B351 Tinea unguium: Secondary | ICD-10-CM | POA: Diagnosis not present

## 2017-04-26 DIAGNOSIS — M79673 Pain in unspecified foot: Secondary | ICD-10-CM | POA: Diagnosis not present

## 2017-04-26 DIAGNOSIS — L600 Ingrowing nail: Secondary | ICD-10-CM | POA: Diagnosis not present

## 2017-04-26 DIAGNOSIS — R262 Difficulty in walking, not elsewhere classified: Secondary | ICD-10-CM | POA: Diagnosis not present

## 2017-05-13 DIAGNOSIS — R300 Dysuria: Secondary | ICD-10-CM | POA: Diagnosis not present

## 2017-06-14 DIAGNOSIS — I10 Essential (primary) hypertension: Secondary | ICD-10-CM | POA: Diagnosis not present

## 2017-06-14 DIAGNOSIS — R54 Age-related physical debility: Secondary | ICD-10-CM | POA: Diagnosis not present

## 2017-06-14 DIAGNOSIS — R05 Cough: Secondary | ICD-10-CM | POA: Diagnosis not present

## 2017-06-14 DIAGNOSIS — G309 Alzheimer's disease, unspecified: Secondary | ICD-10-CM | POA: Diagnosis not present

## 2017-06-14 DIAGNOSIS — R269 Unspecified abnormalities of gait and mobility: Secondary | ICD-10-CM | POA: Diagnosis not present

## 2017-06-14 DIAGNOSIS — J45909 Unspecified asthma, uncomplicated: Secondary | ICD-10-CM | POA: Diagnosis not present

## 2017-06-14 DIAGNOSIS — R609 Edema, unspecified: Secondary | ICD-10-CM | POA: Diagnosis not present

## 2017-06-14 DIAGNOSIS — M546 Pain in thoracic spine: Secondary | ICD-10-CM | POA: Diagnosis not present

## 2017-06-23 ENCOUNTER — Telehealth: Payer: Self-pay | Admitting: General Practice

## 2017-06-23 DIAGNOSIS — R5381 Other malaise: Secondary | ICD-10-CM

## 2017-06-23 NOTE — Telephone Encounter (Signed)
Family is requesting Dr Dossie Arbourrissman to put in a referral to [facility].  Caroline Wilkerson Shelia Fax # [phone] or phone [phone]  Patient states they feel the patient is declining with dementia worsening ...  Thanks

## 2017-06-24 NOTE — Telephone Encounter (Signed)
Please advise Pt last seen 01/2016 

## 2017-06-24 NOTE — Telephone Encounter (Signed)
OK to put in referral.  Let me know what kind of referral I need to place

## 2017-06-29 NOTE — Telephone Encounter (Signed)
Please put in referral.  I will sign

## 2017-06-29 NOTE — Telephone Encounter (Signed)
A referral to home health.

## 2017-07-01 DIAGNOSIS — Z79899 Other long term (current) drug therapy: Secondary | ICD-10-CM | POA: Diagnosis not present

## 2017-07-01 NOTE — Telephone Encounter (Signed)
Order entered

## 2017-07-04 DIAGNOSIS — E119 Type 2 diabetes mellitus without complications: Secondary | ICD-10-CM | POA: Diagnosis not present

## 2017-07-04 DIAGNOSIS — Z79899 Other long term (current) drug therapy: Secondary | ICD-10-CM | POA: Diagnosis not present

## 2017-07-12 DIAGNOSIS — N289 Disorder of kidney and ureter, unspecified: Secondary | ICD-10-CM | POA: Diagnosis not present

## 2017-07-12 DIAGNOSIS — M546 Pain in thoracic spine: Secondary | ICD-10-CM | POA: Diagnosis not present

## 2017-07-12 DIAGNOSIS — R609 Edema, unspecified: Secondary | ICD-10-CM | POA: Diagnosis not present

## 2017-07-12 DIAGNOSIS — R54 Age-related physical debility: Secondary | ICD-10-CM | POA: Diagnosis not present

## 2017-07-12 DIAGNOSIS — R269 Unspecified abnormalities of gait and mobility: Secondary | ICD-10-CM | POA: Diagnosis not present

## 2017-07-12 DIAGNOSIS — I10 Essential (primary) hypertension: Secondary | ICD-10-CM | POA: Diagnosis not present

## 2017-07-12 DIAGNOSIS — G309 Alzheimer's disease, unspecified: Secondary | ICD-10-CM | POA: Diagnosis not present

## 2017-07-12 DIAGNOSIS — R05 Cough: Secondary | ICD-10-CM | POA: Diagnosis not present

## 2017-07-15 DIAGNOSIS — L851 Acquired keratosis [keratoderma] palmaris et plantaris: Secondary | ICD-10-CM | POA: Diagnosis not present

## 2017-07-15 DIAGNOSIS — E1121 Type 2 diabetes mellitus with diabetic nephropathy: Secondary | ICD-10-CM | POA: Diagnosis not present

## 2017-07-15 DIAGNOSIS — L602 Onychogryphosis: Secondary | ICD-10-CM | POA: Diagnosis not present

## 2017-07-15 DIAGNOSIS — M79673 Pain in unspecified foot: Secondary | ICD-10-CM | POA: Diagnosis not present

## 2017-07-19 DIAGNOSIS — N289 Disorder of kidney and ureter, unspecified: Secondary | ICD-10-CM | POA: Diagnosis not present

## 2017-07-19 DIAGNOSIS — R54 Age-related physical debility: Secondary | ICD-10-CM | POA: Diagnosis not present

## 2017-07-19 DIAGNOSIS — I10 Essential (primary) hypertension: Secondary | ICD-10-CM | POA: Diagnosis not present

## 2017-07-19 DIAGNOSIS — N390 Urinary tract infection, site not specified: Secondary | ICD-10-CM | POA: Diagnosis not present

## 2017-07-19 DIAGNOSIS — R609 Edema, unspecified: Secondary | ICD-10-CM | POA: Diagnosis not present

## 2017-07-19 DIAGNOSIS — G309 Alzheimer's disease, unspecified: Secondary | ICD-10-CM | POA: Diagnosis not present

## 2017-07-19 DIAGNOSIS — M546 Pain in thoracic spine: Secondary | ICD-10-CM | POA: Diagnosis not present

## 2017-07-19 DIAGNOSIS — R05 Cough: Secondary | ICD-10-CM | POA: Diagnosis not present

## 2017-07-20 DIAGNOSIS — R3915 Urgency of urination: Secondary | ICD-10-CM | POA: Diagnosis not present

## 2017-09-25 ENCOUNTER — Emergency Department: Payer: Medicare Other

## 2017-09-25 ENCOUNTER — Encounter: Payer: Self-pay | Admitting: Emergency Medicine

## 2017-09-25 ENCOUNTER — Emergency Department
Admission: EM | Admit: 2017-09-25 | Discharge: 2017-09-25 | Disposition: A | Discharge status: Home / Self Care | Payer: Medicare Other | Attending: Emergency Medicine | Admitting: Emergency Medicine

## 2017-09-25 DIAGNOSIS — G309 Alzheimer's disease, unspecified: Secondary | ICD-10-CM | POA: Insufficient documentation

## 2017-09-25 DIAGNOSIS — I129 Hypertensive chronic kidney disease with stage 1 through stage 4 chronic kidney disease, or unspecified chronic kidney disease: Secondary | ICD-10-CM | POA: Insufficient documentation

## 2017-09-25 DIAGNOSIS — Y939 Activity, unspecified: Secondary | ICD-10-CM | POA: Diagnosis not present

## 2017-09-25 DIAGNOSIS — W19XXXA Unspecified fall, initial encounter: Secondary | ICD-10-CM | POA: Diagnosis not present

## 2017-09-25 DIAGNOSIS — W010XXA Fall on same level from slipping, tripping and stumbling without subsequent striking against object, initial encounter: Secondary | ICD-10-CM | POA: Diagnosis not present

## 2017-09-25 DIAGNOSIS — Z79899 Other long term (current) drug therapy: Secondary | ICD-10-CM | POA: Insufficient documentation

## 2017-09-25 DIAGNOSIS — Y929 Unspecified place or not applicable: Secondary | ICD-10-CM | POA: Diagnosis not present

## 2017-09-25 DIAGNOSIS — S0181XA Laceration without foreign body of other part of head, initial encounter: Secondary | ICD-10-CM

## 2017-09-25 DIAGNOSIS — Y999 Unspecified external cause status: Secondary | ICD-10-CM | POA: Insufficient documentation

## 2017-09-25 DIAGNOSIS — S0990XA Unspecified injury of head, initial encounter: Secondary | ICD-10-CM | POA: Diagnosis not present

## 2017-09-25 DIAGNOSIS — S0993XA Unspecified injury of face, initial encounter: Secondary | ICD-10-CM | POA: Diagnosis present

## 2017-09-25 DIAGNOSIS — N189 Chronic kidney disease, unspecified: Secondary | ICD-10-CM | POA: Diagnosis not present

## 2017-09-25 HISTORY — DX: Type 2 diabetes mellitus without complications: E11.9

## 2017-09-25 NOTE — ED Notes (Signed)
Hospice nurse Samule Dry [phone] at bedside at this time to check on pt. Hospice nurse asks to be called for updates and will call son of pt HCPOA.

## 2017-09-25 NOTE — ED Notes (Addendum)
Spoke with Steve Rattler from [employer] who states that they do not have transportation on the weekend. Pt son is coming to pick patient up.

## 2017-09-25 NOTE — ED Notes (Signed)
Cindy from [facility] called for update on patient. Cindy informed pts CT did not show anything acute and that laceration on head was glued and steri strips applied.

## 2017-09-25 NOTE — ED Triage Notes (Signed)
Pt arrives via [facility] from [facility] for a witnessed fall. Pt was being assisted onto toilet by staff when she slipped and fell. Pt has laceration to right side of head. Per EMS, VS WDL. Pt is smiling and in NAD at this time.

## 2017-09-25 NOTE — ED Provider Notes (Signed)
[name] Emergency Department Provider Note   ____________________________________________    I have reviewed the triage vital signs and the nursing notes.   HISTORY  Chief Complaint Head Laceration    History is limited as patient has Alzheimer's dementia  HPI Caroline Wilkerson is a 83 y.o. female Who presents after a witnessed fall. Patient is on hospice for severe Alzheimer's dementia, apparently she was being helped onto the toilet and fell and struck the right side of her head on a wall.no LOC, no seizure activity reported.   Past Medical History:  Diagnosis Date  . Alzheimer's dementia   . Arthritis   . Cardiomegaly   . Chronic kidney disease   . Diabetes mellitus without complication (HCC)   . Fibrocystic breast   . Hearing loss   . Hyperlipidemia   . Hypertension   . Vertigo     Patient Active Problem List   Diagnosis Date Noted  . Cellulitis 02/17/2016  . Generalized weakness 02/17/2016  . Decubitus ulcer of buttock, stage 1 10/21/2015  . Hypertension   . Hyperlipidemia   . Alzheimer's dementia     Past Surgical History:  Procedure Laterality Date  . ABDOMINAL HYSTERECTOMY    . BREAST SURGERY    . CARPAL TUNNEL RELEASE    . CATARACT EXTRACTION Bilateral   . CHOLECYSTECTOMY    . JOINT REPLACEMENT Right    knee and hip  . rotator cuff surgery Bilateral     Prior to Admission medications   Medication Sig Start Date End Date Taking? Authorizing Provider  acetaminophen (TYLENOL) 500 MG tablet Take 500 mg by mouth every 6 (six) hours as needed for moderate pain.     [provider]  calcium carbonate (OS-CAL) 600 MG TABS tablet Take 600 mg by mouth 2 (two) times daily with a meal.    [provider]  clindamycin (CLEOCIN) 300 MG capsule Take 1 capsule (300 mg total) by mouth every 6 (six) hours. 02/20/16   Enedina Finner, MD  meloxicam (MOBIC) 7.5 MG tablet Take 7.5 mg by mouth 2 (two) times daily.     [provider]  mirabegron ER (MYRBETRIQ) 25 MG TB24 tablet Take 1 tablet (25 mg total) by mouth daily. 10/09/15   Steele Sizer, MD  mirtazapine (REMERON SOL-TAB) 15 MG disintegrating tablet Take 1 tablet (15 mg total) by mouth at bedtime as needed (use for sleep and combatave behaviour). 02/23/16   Steele Sizer, MD  omeprazole (PRILOSEC) 20 MG capsule Take 20 mg by mouth daily.    [provider]     Allergies Codeine  Family History  Problem Relation Age of Onset  . Hypertension Daughter   . Hypertension Son   . Diabetes Son     Social History Social History  Substance Use Topics  . Smoking status: Never Smoker  . Smokeless tobacco: Never Used  . Alcohol use No    ROS caveat: Unable to obtain Review of Systems due to severe dementia     ____________________________________________   PHYSICAL EXAM:  VITAL SIGNS: ED Triage Vitals  Enc Vitals Group     BP 09/25/17 0620 (!) 151/78     Pulse Rate 09/25/17 0620 (!) 57     Resp 09/25/17 0620 16     Temp 09/25/17 0620 97.6 F (36.4 C)     Temp Source 09/25/17 0620 Oral     SpO2 09/25/17 0620 99 %     Weight 09/25/17  0616 79.4 kg (175 lb)     Height 09/25/17 0616 1.676 m ( )     Head Circumference --      Peak Flow --      Pain Score --      Pain Loc --      Pain Edu? --      Excl. in GC? --     Constitutional: Alert. No acute distress.  Eyes: Conjunctivae are normal.  Head: 3 cm laceration, superior aspect of right forehead, horizontal, no active bleeding Nose: No nasal swelling or epistaxis Mouth/Throat: Mucous membranes are moist.   Neck:  Painless ROM, no vertebral tenderness palpation Cardiovascular: Normal rate, regular rhythm.  Respiratory: Normal respiratory effort.  No retractions.  Gastrointestinal: Soft and nontender. No distention.   Genitourinary: deferred Musculoskeletal: back: No vertebral tenderness to palpation,no painful range of motion. Warm and well perfused  extremities, no bony abnormalities, normal range of motion Neurologic:  Normal speech and language. No gross focal neurologic deficits are appreciated.  Skin:  Skin is warm, dry   ____________________________________________   LABS (all labs ordered are listed, but only abnormal results are displayed)  Labs Reviewed - No data to display ____________________________________________  EKG  None ____________________________________________  RADIOLOGY  CT head unremarkable ____________________________________________   PROCEDURES  Procedure(s) performed: yes  LACERATION REPAIR Performed by: Jene Every Authorized by: Jene Every  Patient identity confirmed: provided demographic data Prepped and Draped in normal sterile fashion Wound explored  Laceration Location:forehead  Laceration Length: 3cm  No Foreign Bodies seen or palpated  Irrigation method: syringe Amount of cleaning: standard  Skin closure: skin glue and steri-strips  Patient tolerance: Patient tolerated the procedure well with no immediate complications.     Critical Care performed: No ____________________________________________   INITIAL IMPRESSION / ASSESSMENT AND PLAN / ED COURSE  Pertinent labs & imaging results that were available during my care of the patient were reviewed by me and considered in my medical decision making (see chart for details).  patient well-appearing and in no acute distress. Witnessed, relatively controlled fall apparently. Laceration to the forehead., This was repaired, CT head unremarkable. Okay for discharge at this time.    ____________________________________________   FINAL CLINICAL IMPRESSION(S) / ED DIAGNOSES  Final diagnoses:  Injury of head, initial encounter  Laceration of forehead, initial encounter      NEW MEDICATIONS STARTED DURING THIS VISIT:  New Prescriptions   No medications on file     Note:  This document was prepared using  Dragon voice recognition software and may include unintentional dictation errors.    Jene Every, MD 09/25/17 [phone]

## 2017-10-03 DIAGNOSIS — R269 Unspecified abnormalities of gait and mobility: Secondary | ICD-10-CM | POA: Diagnosis not present

## 2017-10-03 DIAGNOSIS — L603 Nail dystrophy: Secondary | ICD-10-CM | POA: Diagnosis not present

## 2017-10-03 DIAGNOSIS — M79673 Pain in unspecified foot: Secondary | ICD-10-CM | POA: Diagnosis not present

## 2017-10-03 DIAGNOSIS — E1121 Type 2 diabetes mellitus with diabetic nephropathy: Secondary | ICD-10-CM | POA: Diagnosis not present

## 2017-10-05 DIAGNOSIS — Z961 Presence of intraocular lens: Secondary | ICD-10-CM | POA: Diagnosis not present

## 2017-10-11 DIAGNOSIS — R69 Illness, unspecified: Secondary | ICD-10-CM | POA: Diagnosis not present

## 2017-11-07 DIAGNOSIS — E119 Type 2 diabetes mellitus without complications: Secondary | ICD-10-CM | POA: Diagnosis not present

## 2017-11-07 DIAGNOSIS — Z79899 Other long term (current) drug therapy: Secondary | ICD-10-CM | POA: Diagnosis not present

## 2017-11-17 DIAGNOSIS — R0602 Shortness of breath: Secondary | ICD-10-CM | POA: Diagnosis not present

## 2017-11-17 DIAGNOSIS — R05 Cough: Secondary | ICD-10-CM | POA: Diagnosis not present

## 2017-12-16 ENCOUNTER — Emergency Department: Payer: Medicare Other

## 2017-12-16 ENCOUNTER — Inpatient Hospital Stay
Admission: EM | Admit: 2017-12-16 | Discharge: 2017-12-19 | DRG: 084 | Disposition: A | Discharge status: Nursing Home (Includes ICF) | Payer: Medicare Other | Attending: Internal Medicine | Admitting: Internal Medicine

## 2017-12-16 DIAGNOSIS — F0280 Dementia in other diseases classified elsewhere without behavioral disturbance: Secondary | ICD-10-CM | POA: Diagnosis present

## 2017-12-16 DIAGNOSIS — I129 Hypertensive chronic kidney disease with stage 1 through stage 4 chronic kidney disease, or unspecified chronic kidney disease: Secondary | ICD-10-CM | POA: Diagnosis present

## 2017-12-16 DIAGNOSIS — S0101XA Laceration without foreign body of scalp, initial encounter: Secondary | ICD-10-CM | POA: Diagnosis present

## 2017-12-16 DIAGNOSIS — S065XAA Traumatic subdural hemorrhage with loss of consciousness status unknown, initial encounter: Secondary | ICD-10-CM | POA: Diagnosis present

## 2017-12-16 DIAGNOSIS — W06XXXA Fall from bed, initial encounter: Secondary | ICD-10-CM | POA: Diagnosis present

## 2017-12-16 DIAGNOSIS — G309 Alzheimer's disease, unspecified: Secondary | ICD-10-CM | POA: Diagnosis present

## 2017-12-16 DIAGNOSIS — S065X9A Traumatic subdural hemorrhage with loss of consciousness of unspecified duration, initial encounter: Secondary | ICD-10-CM | POA: Diagnosis not present

## 2017-12-16 DIAGNOSIS — N189 Chronic kidney disease, unspecified: Secondary | ICD-10-CM | POA: Diagnosis present

## 2017-12-16 DIAGNOSIS — Y92129 Unspecified place in nursing home as the place of occurrence of the external cause: Secondary | ICD-10-CM

## 2017-12-16 DIAGNOSIS — Z79899 Other long term (current) drug therapy: Secondary | ICD-10-CM

## 2017-12-16 DIAGNOSIS — S0990XA Unspecified injury of head, initial encounter: Secondary | ICD-10-CM

## 2017-12-16 DIAGNOSIS — H9190 Unspecified hearing loss, unspecified ear: Secondary | ICD-10-CM | POA: Diagnosis present

## 2017-12-16 DIAGNOSIS — Z66 Do not resuscitate: Secondary | ICD-10-CM | POA: Diagnosis present

## 2017-12-16 DIAGNOSIS — Z885 Allergy status to narcotic agent status: Secondary | ICD-10-CM

## 2017-12-16 DIAGNOSIS — E785 Hyperlipidemia, unspecified: Secondary | ICD-10-CM | POA: Diagnosis present

## 2017-12-16 DIAGNOSIS — E1122 Type 2 diabetes mellitus with diabetic chronic kidney disease: Secondary | ICD-10-CM | POA: Diagnosis present

## 2017-12-16 MED ORDER — LIDOCAINE-EPINEPHRINE-TETRACAINE (LET) SOLUTION
NASAL | Status: AC
  Filled 2017-12-16: qty 6

## 2017-12-16 MED ORDER — LIDOCAINE-EPINEPHRINE-TETRACAINE (LET) SOLUTION
9.0000 mL | Freq: Once | NASAL | Status: AC
  Administered 2017-12-16: 9 mL via TOPICAL

## 2017-12-16 MED ORDER — LIDOCAINE-EPINEPHRINE-TETRACAINE (LET) SOLUTION
NASAL | Status: AC
  Administered 2017-12-16: 3 mL via TOPICAL
  Filled 2017-12-16: qty 6

## 2017-12-16 MED ORDER — LIDOCAINE-EPINEPHRINE-TETRACAINE (LET) SOLUTION
3.0000 mL | Freq: Once | NASAL | Status: AC
  Administered 2017-12-16: 3 mL via TOPICAL

## 2017-12-16 MED ORDER — ONDANSETRON HCL 4 MG/2ML IJ SOLN
4.0000 mg | Freq: Once | INTRAMUSCULAR | Status: AC
  Administered 2017-12-16: 4 mg via INTRAVENOUS

## 2017-12-16 MED ORDER — LIDOCAINE-EPINEPHRINE (PF) 1 %-1:200000 IJ SOLN
INTRAMUSCULAR | Status: AC
  Filled 2017-12-16: qty 30

## 2017-12-16 MED ORDER — BACITRACIN ZINC 500 UNIT/GM EX OINT
TOPICAL_OINTMENT | Freq: Once | CUTANEOUS | Status: AC
  Administered 2017-12-17: 1 via TOPICAL
  Filled 2017-12-16: qty 0.9

## 2017-12-16 MED ORDER — ONDANSETRON HCL 4 MG/2ML IJ SOLN
INTRAMUSCULAR | Status: AC
  Administered 2017-12-16: 4 mg via INTRAVENOUS
  Filled 2017-12-16: qty 2

## 2017-12-16 MED ORDER — TETANUS-DIPHTH-ACELL PERTUSSIS 5-2.5-18.5 LF-MCG/0.5 IM SUSP
0.5000 mL | Freq: Once | INTRAMUSCULAR | Status: AC
  Administered 2017-12-17: 0.5 mL via INTRAMUSCULAR
  Filled 2017-12-16: qty 0.5

## 2017-12-16 NOTE — ED Provider Notes (Addendum)
[facility] Emergency Department Provider Note    First MD Initiated Contact with Patient 12/16/17 2128     (approximate)  I have reviewed the triage vital signs and the nursing notes.   HISTORY  Chief Complaint Fall  Level V Caveat:  Advanced dementia HPI Caroline Wilkerson is a 84 y.o. female presents from nursing facility after unwitnessed fall out of bed where she fell back hit the back of her head on the corner of a wall that had metal plating on the corner.  Uncertain LOC.  Patient with baseline dementia denies any pain at this point.  No reported neck pain.  No chest pain or abdominal pain.  No recent fevers.  She is not on any blood thinners.  She is a DNR.  Past Medical History:  Diagnosis Date  . Alzheimer's dementia   . Arthritis   . Cardiomegaly   . Chronic kidney disease   . Diabetes mellitus without complication (HCC)   . Fibrocystic breast   . Hearing loss   . Hyperlipidemia   . Hypertension   . Vertigo    Family History  Problem Relation Age of Onset  . Hypertension Daughter   . Hypertension Son   . Diabetes Son    Past Surgical History:  Procedure Laterality Date  . ABDOMINAL HYSTERECTOMY    . BREAST SURGERY    . CARPAL TUNNEL RELEASE    . CATARACT EXTRACTION Bilateral   . CHOLECYSTECTOMY    . JOINT REPLACEMENT Right    knee and hip  . rotator cuff surgery Bilateral    Patient Active Problem List   Diagnosis Date Noted  . Cellulitis 02/17/2016  . Generalized weakness 02/17/2016  . Decubitus ulcer of buttock, stage 1 10/21/2015  . Hypertension   . Hyperlipidemia   . Alzheimer's dementia       Prior to Admission medications   Medication Sig Start Date End Date Taking? Authorizing Provider  acetaminophen (TYLENOL) 500 MG tablet Take 500 mg by mouth every 6 (six) hours as needed for moderate pain.     [provider]  calcium carbonate (OS-CAL) 600 MG TABS tablet Take 600 mg by mouth 2 (two) times daily with a  meal.    [provider]  clindamycin (CLEOCIN) 300 MG capsule Take 1 capsule (300 mg total) by mouth every 6 (six) hours. 02/20/16   Enedina Finner, MD  meloxicam (MOBIC) 7.5 MG tablet Take 7.5 mg by mouth 2 (two) times daily.    [provider]  mirabegron ER (MYRBETRIQ) 25 MG TB24 tablet Take 1 tablet (25 mg total) by mouth daily. 10/09/15   Steele Sizer, MD  mirtazapine (REMERON SOL-TAB) 15 MG disintegrating tablet Take 1 tablet (15 mg total) by mouth at bedtime as needed (use for sleep and combatave behaviour). 02/23/16   Steele Sizer, MD  omeprazole (PRILOSEC) 20 MG capsule Take 20 mg by mouth daily.    [provider]    Allergies Codeine    Social History Social History   Tobacco Use  . Smoking status: Never Smoker  . Smokeless tobacco: Never Used  Substance Use Topics  . Alcohol use: No  . Drug use: No    Review of Systems Patient denies headaches, rhinorrhea, blurry vision, numbness, shortness of breath, chest pain, edema, cough, abdominal pain, nausea, vomiting, diarrhea, dysuria, fevers, rashes or hallucinations unless otherwise stated above in HPI. ____________________________________________   PHYSICAL EXAM:  VITAL SIGNS: Vitals:   12/16/17  2230 12/16/17 2300  BP: (!) 159/65 (!) 158/90  Pulse: 65   Resp: 18   Temp:    SpO2: 91%     Constitutional: Alert in no acute distress. Eyes: Conjunctivae are normal.  Head: 5 cm linear laceration of the right parietal scalp.  It is hemostatic but there is a fair amount of blood surrounding the laceration.  No underlying hematoma.  No evidence of depressed skull fracture.  No battle signs.  No hemotympanum.  No C-spine step-offs or deformities. Nose: No congestion/rhinnorhea. Mouth/Throat: Mucous membranes are moist.   Neck: No stridor. Painless ROM.  Cardiovascular: Normal rate, regular rhythm. Grossly normal heart sounds.  Good peripheral circulation. Respiratory: Normal respiratory  effort.  No retractions. Lungs CTAB. Gastrointestinal: Soft and nontender. No distention. No abdominal bruits. No CVA tenderness. Genitourinary: Musculoskeletal: No lower extremity tenderness nor edema.  No joint effusions. Neurologic:  Normal speech and language. No gross focal neurologic deficits are appreciated. No facial droop Skin:  Skin is warm, dry and intact. No rash noted. Psychiatric: Mood and affect are normal. Speech and behavior are normal.  ____________________________________________   LABS (all labs ordered are listed, but only abnormal results are displayed)  No results found for this or any previous visit (from the past 24 hour(s)). ____________________________________________ ____________________________________________  RADIOLOGY  I personally reviewed all radiographic images ordered to evaluate for the above acute complaints and reviewed radiology reports and findings.  These findings were personally discussed with the patient.  Please see medical record for radiology report.  ____________________________________________   PROCEDURES  Procedure(s) performed:  Marland KitchenMarland KitchenLaceration Repair Date/Time: 12/16/2017 11:02 PM Performed by: Willy Eddy, MD Authorized by: Willy Eddy, MD   Consent:    Consent obtained:  Verbal   Consent given by:  Patient   Risks discussed:  Infection, pain, retained foreign body, poor cosmetic result and poor wound healing Anesthesia (see MAR for exact dosages):    Anesthesia method:  Topical application   Topical anesthetic:  LET Laceration details:    Location:  Scalp   Scalp location:  R parietal   Length (cm):  5   Depth (mm):  5 Repair type:    Repair type:  Simple Exploration:    Hemostasis achieved with:  Direct pressure   Wound exploration: entire depth of wound probed and visualized     Contaminated: no   Treatment:    Area cleansed with:  Saline and Hibiclens   Amount of cleaning:  Extensive   Irrigation  solution:  Sterile saline   Visualized foreign bodies/material removed: no   Skin repair:    Repair method:  Staples   Number of staples:  11 Approximation:    Approximation:  Close Post-procedure details:    Dressing:  Sterile dressing   Patient tolerance of procedure:  Tolerated well, no immediate complications      Critical Care performed: no ____________________________________________   INITIAL IMPRESSION / ASSESSMENT AND PLAN / ED COURSE  Pertinent labs & imaging results that were available during my care of the patient were reviewed by me and considered in my medical decision making (see chart for details).  DDX: sah, sdh, edh, fracture, contusion, soft tissue injury, viscous injury, concussion, hemorrhage  Caroline Wilkerson is a 84 y.o. who presents to the ED with head injury as described above.  CT imaging ordered to evaluate for traumatic injury shows evidence of trace subdural.  Wound care provided as above.  Clinical Course as of Dec 16 2357  Fri Dec 16, 2017  2221 Patient's son and power of attorney is at bedside.  Discussed findings of trace subdural hematoma with patient and son.  Patient's goals of care is that she is a DNR will be made comfortable.  Would not want any interventions.  Discussed options for care including transfer to tertiary center for neurosurgical evaluation, observation in the ER with repeat CT head,, versus comfort care management.  Patient son states that he would not want repeat imaging done or for her to be transferred and that she would not want anything acted upon regardless of results.  [PR]  2355 Allergies contacted by the patient's hospice nurse she states that she is not comfortable with patient being discharged back to her facility.  Apparently is not staffing they can appropriately manage her symptoms and reassess her.  She appears comfortable right now with no deterioration.  However do not believe that it is safe from the patient's best  interest to be discharged back to facility with inadequate staffing.  Particularly with her goals of care being comfort do believe some needs to be available for evaluating her symptoms.  She is having some nausea for which she is received IV Zofran.  We will check blood work.  Will speak with hospitalist for admission for comfort care.  [PR]    Clinical Course User Index [PR] Willy Eddyobinson, Cena Bruhn, MD   Patient has been observed in the ER with no worsening of condition.  Wound repair and wound care provided.  Based on patient's goals of care I do believe patient is appropriate for discharge back to facility.  She appears comfortable and in no acute distress.  Will give follow-up with neurosurgery. ____________________________________________   FINAL CLINICAL IMPRESSION(S) / ED DIAGNOSES  Final diagnoses:  Injury of head, initial encounter  Subdural hematoma due to concussion, with loss of consciousness, initial encounter (HCC)  Scalp laceration, initial encounter      NEW MEDICATIONS STARTED DURING THIS VISIT:  This SmartLink is deprecated. Use AVSMEDLIST instead to display the medication list for a patient.   Note:  This document was prepared using Dragon voice recognition software and may include unintentional dictation errors.    Willy Eddyobinson, Emberlin Verner, MD 12/16/17 [phone]    Willy Eddyobinson, Harm Jou, MD 12/16/17 [phone]

## 2017-12-16 NOTE — ED Triage Notes (Signed)
Patient coming from [facility] [place] via EMS. Per EMS, patient had mechanical fall and struck wall. Patient left dent on wall. Patient has large laceration to posterior head. Patient has hx of dementia and is oriented at baseline per facility.

## 2017-12-17 ENCOUNTER — Other Ambulatory Visit: Payer: Self-pay

## 2017-12-17 ENCOUNTER — Encounter: Payer: Self-pay | Admitting: Internal Medicine

## 2017-12-17 DIAGNOSIS — S065XAA Traumatic subdural hemorrhage with loss of consciousness status unknown, initial encounter: Secondary | ICD-10-CM | POA: Diagnosis present

## 2017-12-17 DIAGNOSIS — S065X9A Traumatic subdural hemorrhage with loss of consciousness of unspecified duration, initial encounter: Secondary | ICD-10-CM | POA: Diagnosis present

## 2017-12-17 DIAGNOSIS — G309 Alzheimer's disease, unspecified: Secondary | ICD-10-CM | POA: Diagnosis present

## 2017-12-17 DIAGNOSIS — I129 Hypertensive chronic kidney disease with stage 1 through stage 4 chronic kidney disease, or unspecified chronic kidney disease: Secondary | ICD-10-CM | POA: Diagnosis present

## 2017-12-17 DIAGNOSIS — H9190 Unspecified hearing loss, unspecified ear: Secondary | ICD-10-CM | POA: Diagnosis present

## 2017-12-17 DIAGNOSIS — F0280 Dementia in other diseases classified elsewhere without behavioral disturbance: Secondary | ICD-10-CM | POA: Diagnosis present

## 2017-12-17 DIAGNOSIS — E1122 Type 2 diabetes mellitus with diabetic chronic kidney disease: Secondary | ICD-10-CM | POA: Diagnosis present

## 2017-12-17 DIAGNOSIS — W06XXXA Fall from bed, initial encounter: Secondary | ICD-10-CM | POA: Diagnosis present

## 2017-12-17 DIAGNOSIS — S0101XA Laceration without foreign body of scalp, initial encounter: Secondary | ICD-10-CM | POA: Diagnosis present

## 2017-12-17 DIAGNOSIS — Z23 Encounter for immunization: Secondary | ICD-10-CM | POA: Diagnosis present

## 2017-12-17 DIAGNOSIS — Y92129 Unspecified place in nursing home as the place of occurrence of the external cause: Secondary | ICD-10-CM | POA: Diagnosis not present

## 2017-12-17 DIAGNOSIS — Z885 Allergy status to narcotic agent status: Secondary | ICD-10-CM | POA: Diagnosis not present

## 2017-12-17 DIAGNOSIS — E785 Hyperlipidemia, unspecified: Secondary | ICD-10-CM | POA: Diagnosis present

## 2017-12-17 DIAGNOSIS — Z79899 Other long term (current) drug therapy: Secondary | ICD-10-CM | POA: Diagnosis not present

## 2017-12-17 DIAGNOSIS — Z66 Do not resuscitate: Secondary | ICD-10-CM | POA: Diagnosis present

## 2017-12-17 DIAGNOSIS — N189 Chronic kidney disease, unspecified: Secondary | ICD-10-CM | POA: Diagnosis present

## 2017-12-17 LAB — CBC WITH DIFFERENTIAL/PLATELET
BASOS ABS: 0.1 10*3/uL (ref 0–0.1)
BASOS PCT: 1 %
EOS PCT: 3 %
Eosinophils Absolute: 0.3 10*3/uL (ref 0–0.7)
HCT: 40.7 % (ref 35.0–47.0)
Hemoglobin: 13.5 g/dL (ref 12.0–16.0)
Lymphocytes Relative: 28 %
Lymphs Abs: 2.7 10*3/uL (ref 1.0–3.6)
MCH: 31.4 pg (ref 26.0–34.0)
MCHC: 33.1 g/dL (ref 32.0–36.0)
MCV: 94.9 fL (ref 80.0–100.0)
Monocytes Absolute: 0.8 10*3/uL (ref 0.2–0.9)
Monocytes Relative: 9 %
NEUTROS ABS: 5.8 10*3/uL (ref 1.4–6.5)
Neutrophils Relative %: 59 %
PLATELETS: 195 10*3/uL (ref 150–440)
RBC: 4.29 MIL/uL (ref 3.80–5.20)
RDW: 12.6 % (ref 11.5–14.5)
WBC: 9.7 10*3/uL (ref 3.6–11.0)

## 2017-12-17 LAB — CBC
HCT: 37.9 % (ref 35.0–47.0)
Hemoglobin: 12.9 g/dL (ref 12.0–16.0)
MCH: 32.1 pg (ref 26.0–34.0)
MCHC: 34.1 g/dL (ref 32.0–36.0)
MCV: 94.1 fL (ref 80.0–100.0)
PLATELETS: 178 10*3/uL (ref 150–440)
RBC: 4.03 MIL/uL (ref 3.80–5.20)
RDW: 12.6 % (ref 11.5–14.5)
WBC: 9.9 10*3/uL (ref 3.6–11.0)

## 2017-12-17 LAB — BASIC METABOLIC PANEL
ANION GAP: 9 (ref 5–15)
Anion gap: 9 (ref 5–15)
BUN: 32 mg/dL — ABNORMAL HIGH (ref 6–20)
BUN: 25 mg/dL — ABNORMAL HIGH (ref 6–20)
CALCIUM: 8.8 mg/dL — AB (ref 8.9–10.3)
CHLORIDE: 99 mmol/L — AB (ref 101–111)
CO2: 31 mmol/L (ref 22–32)
CO2: 32 mmol/L (ref 22–32)
CREATININE: 0.93 mg/dL (ref 0.44–1.00)
Calcium: 9 mg/dL (ref 8.9–10.3)
Chloride: 97 mmol/L — ABNORMAL LOW (ref 101–111)
Creatinine, Ser: 1.09 mg/dL — ABNORMAL HIGH (ref 0.44–1.00)
GFR, EST AFRICAN AMERICAN: 49 mL/min — AB (ref >60)
GFR, EST AFRICAN AMERICAN: 60 mL/min — AB (ref >60)
GFR, EST NON AFRICAN AMERICAN: 43 mL/min — AB (ref >60)
GFR, EST NON AFRICAN AMERICAN: 52 mL/min — AB (ref >60)
Glucose, Bld: 90 mg/dL (ref 65–99)
Glucose, Bld: 108 mg/dL — ABNORMAL HIGH (ref 65–99)
POTASSIUM: 3.7 mmol/L (ref 3.5–5.1)
Potassium: 3.8 mmol/L (ref 3.5–5.1)
Sodium: 137 mmol/L (ref 135–145)
Sodium: 140 mmol/L (ref 135–145)

## 2017-12-17 LAB — GLUCOSE, CAPILLARY: Glucose-Capillary: 93 mg/dL (ref 65–99)

## 2017-12-17 LAB — MRSA PCR SCREENING: MRSA BY PCR: NEGATIVE

## 2017-12-17 MED ORDER — ACETAMINOPHEN 500 MG PO TABS
500.0000 mg | ORAL_TABLET | Freq: Four times a day (QID) | ORAL | Status: DC | PRN
  Administered 2017-12-18 – 2017-12-19 (×2): 500 mg via ORAL
  Filled 2017-12-17 (×2): qty 1

## 2017-12-17 MED ORDER — ONDANSETRON HCL 4 MG PO TABS: 4.0000 mg | ORAL_TABLET | Freq: Four times a day (QID) | ORAL | Status: DC | PRN

## 2017-12-17 MED ORDER — SODIUM CHLORIDE 0.9 % IV SOLN
Freq: Once | INTRAVENOUS | Status: AC
  Administered 2017-12-17: 03:00:00 via INTRAVENOUS

## 2017-12-17 MED ORDER — PANTOPRAZOLE SODIUM 40 MG PO TBEC
40.0000 mg | DELAYED_RELEASE_TABLET | Freq: Every day | ORAL | Status: DC
  Administered 2017-12-17 – 2017-12-19 (×3): 40 mg via ORAL
  Filled 2017-12-17 (×3): qty 1

## 2017-12-17 MED ORDER — MIRTAZAPINE 15 MG PO TBDP
15.0000 mg | ORAL_TABLET | Freq: Every evening | ORAL | Status: DC | PRN
  Filled 2017-12-17: qty 1

## 2017-12-17 MED ORDER — TRAZODONE HCL 50 MG PO TABS
25.0000 mg | ORAL_TABLET | Freq: Every evening | ORAL | Status: DC | PRN
  Filled 2017-12-17: qty 1

## 2017-12-17 MED ORDER — BISACODYL 5 MG PO TBEC: 5.0000 mg | DELAYED_RELEASE_TABLET | Freq: Every day | ORAL | Status: DC | PRN

## 2017-12-17 MED ORDER — ONDANSETRON HCL 4 MG/2ML IJ SOLN: 4.0000 mg | Freq: Four times a day (QID) | INTRAMUSCULAR | Status: DC | PRN

## 2017-12-17 MED ORDER — DOCUSATE SODIUM 100 MG PO CAPS
100.0000 mg | ORAL_CAPSULE | Freq: Two times a day (BID) | ORAL | Status: DC
  Administered 2017-12-17 – 2017-12-18 (×3): 100 mg via ORAL
  Filled 2017-12-17 (×4): qty 1

## 2017-12-17 NOTE — Clinical Social Work Note (Signed)
Clinical Social Work Assessment  Patient Details  Name: Caroline Wilkerson MRN: [id number] Date of Birth: 15-Jun-1923  Date of referral:  12/17/17               Reason for consult:  Facility Placement                Permission sought to share information with:  Oceanographeracility Contact Representative Permission granted to share information::  Yes, Verbal Permission Granted  Name::        Agency::  [facility]  Relationship::  ALF  Contact Information:     Housing/Transportation Living arrangements for the past 2 months:  [facility] Source of Information:  Medical Team, Adult Children Patient Interpreter Needed:  None Criminal Activity/Legal Involvement Pertinent to Current Situation/Hospitalization:  No - Comment as needed Significant Relationships:  Merchandiser, retailCommunity Support, Adult Children, Church Lives with:  Facility Resident Do you feel safe going back to the place where you live?  Yes Need for family participation in patient care:  Yes (Comment)(Patient has dementia and is unable to make medical decisions)  Care giving concerns:  [facility] admitted from the [facility] [place]   Social Worker assessment / plan: The CSW spoke with the patient's daughter and son-in-law at bedside to discuss discharge planning. The patient is a resident of the [facility] [place] and is non-ambulatory at baseline. She admitted with an intercranial bleed which is being monitored here for growth. The family at bedside indicated that they are unsure as to the discharge plan until a follow-up CT is performed tomorrow.  The CSW spoke with the patient's son/HCPOA, Caroline Wilkerson, who confirmed that information and also confirmed that the patient is [facility]. The home hospice nurse voiced concern about returning to the ALF until the CT scan is duplicated. The family is considering [facility] with hospice following or pursuing a SNF/LTC with hospice following. CSW will continue to  follow to facilitate discharge. Should the bleed continue to remain at the size it is at this time, the patient can return to the [facility] [address] [place].  Employment status:  Retired Database administratornsurance information:  Managed Medicare PT Recommendations:  Not assessed at this time Information / Referral to community resources:     Patient/Family's Response to care:  The family thanked the CSW for assistance.  Patient/Family's Understanding of and Emotional Response to Diagnosis, Current Treatment, and Prognosis:  The family understands the possible changing needs for the patient and are willing to make changes to her level of care.  Emotional Assessment Appearance:  Appears stated age Attitude/Demeanor/Rapport:  Apprehensive Affect (typically observed):  Apprehensive Orientation:  Oriented to Self Alcohol / Substance use:  Never Used Psych involvement (Current and /or in the community):  No (Comment)  Discharge Needs  Concerns to be addressed:  Care Coordination, Discharge Planning Concerns Readmission within the last 30 days:  No Current discharge risk:  Chronically ill, Cognitively Impaired Barriers to Discharge:  Continued Medical Work up   [employer] CorporationKaren M Innocence Schlotzhauer, LCSW 12/17/2017, 3:38 PM

## 2017-12-17 NOTE — H&P (Signed)
[facility] [place] at [facility]   PATIENT NAME: Caroline Wilkerson    MR#:  [id number]  DATE OF BIRTH:  1923/03/07  DATE OF ADMISSION:  12/16/2017  PRIMARY CARE PHYSICIAN: Patient, No Pcp Per   REQUESTING/REFERRING PHYSICIAN:   CHIEF COMPLAINT:   Chief Complaint  Patient presents with  . Fall    HISTORY OF PRESENT ILLNESS: Caroline Wilkerson  is a 84 y.o. female with a known history of advanced Alzheimer's dementia, chronic kidney disease, hyperlipidemia, hypertension presented to the emergency room after she was sent from assisted living facility for fall.  Patient fell backwards and hit her head on the corner of a wall that had metal bleeding.  Onset and loss of consciousness.  Patient is awake and responds to verbal commands.  She had staples placed for laceration on the scalp.  No complaints of any chest pain, abdominal pain.  Patient is DNR by CODE STATUS.  CT head revealed a thin trace subdural hematoma 2 mm in thickness and a right occipital laceration.  The laceration was stapled in the emergency room.  Family do not want any aggressive measures for the subdural hematoma.  They want just comfort measures.  Patient is on hospice services at the assisted living facility.  Patient is dependent on activities of daily living.  PAST MEDICAL HISTORY:   Past Medical History:  Diagnosis Date  . Alzheimer's dementia   . Arthritis   . Cardiomegaly   . Chronic kidney disease   . Diabetes mellitus without complication (HCC)   . Fibrocystic breast   . Hearing loss   . Hyperlipidemia   . Hypertension   . Vertigo     PAST SURGICAL HISTORY:  Past Surgical History:  Procedure Laterality Date  . ABDOMINAL HYSTERECTOMY    . BREAST SURGERY    . CARPAL TUNNEL RELEASE    . CATARACT EXTRACTION Bilateral   . CHOLECYSTECTOMY    . JOINT REPLACEMENT Right    knee and hip  . rotator cuff surgery Bilateral     SOCIAL HISTORY:  Social History   Tobacco Use  . Smoking  status: Never Smoker  . Smokeless tobacco: Never Used  Substance Use Topics  . Alcohol use: No    FAMILY HISTORY:  Family History  Problem Relation Age of Onset  . Hypertension Daughter   . Hypertension Son   . Diabetes Son     DRUG ALLERGIES:  Allergies  Allergen Reactions  . Codeine Diarrhea and Nausea And Vomiting    REVIEW OF SYSTEMS:  Unable to obtain secondary to dementia  MEDICATIONS AT HOME:  Prior to Admission medications   Medication Sig Start Date End Date Taking? Authorizing Provider  acetaminophen (TYLENOL) 500 MG tablet Take 500 mg by mouth every 6 (six) hours as needed for moderate pain.    Yes [provider]  azithromycin (ZITHROMAX) 250 MG tablet Take 250 mg by mouth daily. Take 2 tablets by mouth on day 1, then take 1 tablet daily for 4 days.   Yes [provider]  calcium carbonate (OS-CAL) 600 MG TABS tablet Take 600 mg by mouth 2 (two) times daily with a meal.   Yes [provider]  citalopram (CELEXA) 10 MG tablet Take 10 mg by mouth daily.   Yes [provider]  fexofenadine (ALLEGRA) 180 MG tablet Take 180 mg by mouth every morning.   Yes [provider]  guaiFENesin (MUCINEX) 600 MG 12 hr tablet Take 1,200 mg by  mouth 2 (two) times daily. For 14 days   Yes [provider]  Lidocaine (ASPERCREME LIDOCAINE) 4 % PTCH Apply 1 patch topically daily. To affected area of back for 12 hours, off for 12 hours   Yes [provider]  meloxicam (MOBIC) 7.5 MG tablet Take 7.5 mg by mouth 2 (two) times daily.   Yes [provider]  mirabegron ER (MYRBETRIQ) 25 MG TB24 tablet Take 1 tablet (25 mg total) by mouth daily. 10/09/15  Yes Crissman, Redge GainerMark A, MD  triamterene-hydrochlorothiazide (MAXZIDE) 75-50 MG tablet Take 1 tablet by mouth daily.   Yes [provider]  clindamycin (CLEOCIN) 300 MG capsule Take 1 capsule (300 mg total) by mouth every 6 (six) hours. Patient not taking: Reported on  12/17/2017 02/20/16   Enedina FinnerPatel, Sona, MD  mirtazapine (REMERON SOL-TAB) 15 MG disintegrating tablet Take 1 tablet (15 mg total) by mouth at bedtime as needed (use for sleep and combatave behaviour). Patient not taking: Reported on 12/17/2017 02/23/16   Steele Sizerrissman, Mark A, MD  omeprazole (PRILOSEC) 20 MG capsule Take 20 mg by mouth daily.    [provider]      PHYSICAL EXAMINATION:   VITAL SIGNS: Blood pressure (!) 144/97, pulse 100, temperature 97.6 F (36.4 C), temperature source Axillary, resp. rate 16, weight 79.4 kg (175 lb), SpO2 90 %.  GENERAL:  84 y.o.-year-old patient lying in the bed with no acute distress.  EYES: Pupils equal, round, reactive to light and accommodation. No scleral icterus. Extraocular muscles intact.  HEENT: Laceration over scalp, normocephalic. Oropharynx and nasopharynx clear.  NECK:  Supple, no jugular venous distention. No thyroid enlargement, no tenderness.  LUNGS: Normal breath sounds bilaterally, no wheezing, rales,rhonchi or crepitation. No use of accessory muscles of respiration.  CARDIOVASCULAR: S1, S2 normal. No murmurs, rubs, or gallops.  ABDOMEN: Soft, nontender, nondistended. Bowel sounds present. No organomegaly or mass.  EXTREMITIES: No pedal edema, cyanosis, or clubbing.  NEUROLOGIC: Cranial nerves II through XII are intact. Muscle strength 5/5 in all extremities. Sensation intact. Gait not checked.  Has dementia PSYCHIATRIC: could not be assessed SKIN: No obvious rash Laceration over occipital area of scalp.  LABORATORY PANEL:   CBC Recent Labs  Lab 12/16/17 2135  WBC 9.7  HGB 13.5  HCT 40.7  PLT 195  MCV 94.9  MCH 31.4  MCHC 33.1  RDW 12.6  LYMPHSABS 2.7  MONOABS 0.8  EOSABS 0.3  BASOSABS 0.1   ------------------------------------------------------------------------------------------------------------------  Chemistries  Recent Labs  Lab 12/16/17 2135  NA 137  K 3.7  CL 97*  CO2 31  GLUCOSE 90  BUN 32*  CREATININE  1.09*  CALCIUM 8.8*   ------------------------------------------------------------------------------------------------------------------ estimated creatinine clearance is 35 mL/min (A) (by C-G formula based on SCr of 1.09 mg/dL (H)). ------------------------------------------------------------------------------------------------------------------ No results for input(s): TSH, T4TOTAL, T3FREE, THYROIDAB in the last 72 hours.  Invalid input(s): FREET3   Coagulation profile No results for input(s): INR, PROTIME in the last 168 hours. ------------------------------------------------------------------------------------------------------------------- No results for input(s): DDIMER in the last 72 hours. -------------------------------------------------------------------------------------------------------------------  Cardiac Enzymes No results for input(s): CKMB, TROPONINI, MYOGLOBIN in the last 168 hours.  Invalid input(s): CK ------------------------------------------------------------------------------------------------------------------ Invalid input(s): POCBNP  ---------------------------------------------------------------------------------------------------------------  Urinalysis    Component Value Date/Time   COLORURINE YELLOW (A) 02/17/2016 1135   APPEARANCEUR HAZY (A) 02/17/2016 1135   APPEARANCEUR Cloudy (A) 11/18/2015 1043   LABSPEC 1.020 02/17/2016 1135   PHURINE 6.0 02/17/2016 1135   GLUCOSEU NEGATIVE 02/17/2016 1135   HGBUR NEGATIVE 02/17/2016 1135  BILIRUBINUR NEGATIVE 02/17/2016 1135   BILIRUBINUR Negative 11/18/2015 1043   KETONESUR NEGATIVE 02/17/2016 1135   PROTEINUR 30 (A) 02/17/2016 1135   NITRITE NEGATIVE 02/17/2016 1135   LEUKOCYTESUR NEGATIVE 02/17/2016 1135   LEUKOCYTESUR Negative 11/18/2015 1043     RADIOLOGY: Ct Head Wo Contrast  Result Date: 12/16/2017 CLINICAL DATA:  Dementia patient post mechanical fall with head trauma and headache. Struck  wall when falling. Laceration to posterior head. EXAM: CT HEAD WITHOUT CONTRAST CT CERVICAL SPINE WITHOUT CONTRAST TECHNIQUE: Multidetector CT imaging of the head and cervical spine was performed following the standard protocol without intravenous contrast. Multiplanar CT image reconstructions of the cervical spine were also generated. COMPARISON:  Head CT 09/22/2017. Head and cervical spine CT 09/11/2016 FINDINGS: CT HEAD FINDINGS Brain: Suspect trace inter hemispheric subdural hemorrhage on the falx measuring 2 mm in maximal thickness. No mass effect. No additional intracranial hemorrhage. Stable atrophy and chronic small vessel ischemia. Cavum septum pellucidum, normal variant. No evidence of acute ischemia. Vascular: No hyperdense vessel.  Skullbase atherosclerosis. Skull: No skull fracture. Sinuses/Orbits: Chronic left maxillary sinus opacification with hyperdense contents. Mild mucosal thickening of ethmoid air cells. Other: Right occipital scalp laceration. CT CERVICAL SPINE FINDINGS Alignment: Unchanged from prior exam. Multilevel degenerative anterolisthesis, stable. No evidence of traumatic subluxation. Skull base and vertebrae: No acute fracture. Vertebral body heights are maintained. Pannus at C1-C2, unchanged. The dens and skull base are intact. Chronic calcifications adjacent to C7 transverse process, likely sequela of remote injury. Soft tissues and spinal canal: No prevertebral fluid or swelling. No visible canal hematoma. Disc levels: Advanced diffuse disc space narrowing and endplate spurring, stable. Advanced multilevel facet arthropathy. Upper chest: No acute finding. Other: Carotid calcifications. IMPRESSION: 1. Trace thin inter hemispheric subdural hematoma measuring 2 mm, new from prior exam. No mass effect. 2. Right occipital scalp laceration without skull fracture. 3. Extensive degenerative change in the cervical spine without acute fracture or subluxation. Critical Value/emergent results  were called by telephone at the time of interpretation on 12/16/2017 at 10:12 pm to Dr. Willy Eddy , who verbally acknowledged these results. Electronically Signed   By: Rubye Oaks M.D.   On: 12/16/2017 22:12   Ct Cervical Spine Wo Contrast  Result Date: 12/16/2017 CLINICAL DATA:  Dementia patient post mechanical fall with head trauma and headache. Struck wall when falling. Laceration to posterior head. EXAM: CT HEAD WITHOUT CONTRAST CT CERVICAL SPINE WITHOUT CONTRAST TECHNIQUE: Multidetector CT imaging of the head and cervical spine was performed following the standard protocol without intravenous contrast. Multiplanar CT image reconstructions of the cervical spine were also generated. COMPARISON:  Head CT 09/22/2017. Head and cervical spine CT 09/11/2016 FINDINGS: CT HEAD FINDINGS Brain: Suspect trace inter hemispheric subdural hemorrhage on the falx measuring 2 mm in maximal thickness. No mass effect. No additional intracranial hemorrhage. Stable atrophy and chronic small vessel ischemia. Cavum septum pellucidum, normal variant. No evidence of acute ischemia. Vascular: No hyperdense vessel.  Skullbase atherosclerosis. Skull: No skull fracture. Sinuses/Orbits: Chronic left maxillary sinus opacification with hyperdense contents. Mild mucosal thickening of ethmoid air cells. Other: Right occipital scalp laceration. CT CERVICAL SPINE FINDINGS Alignment: Unchanged from prior exam. Multilevel degenerative anterolisthesis, stable. No evidence of traumatic subluxation. Skull base and vertebrae: No acute fracture. Vertebral body heights are maintained. Pannus at C1-C2, unchanged. The dens and skull base are intact. Chronic calcifications adjacent to C7 transverse process, likely sequela of remote injury. Soft tissues and spinal canal: No prevertebral fluid or swelling. No visible canal hematoma.  Disc levels: Advanced diffuse disc space narrowing and endplate spurring, stable. Advanced multilevel facet  arthropathy. Upper chest: No acute finding. Other: Carotid calcifications. IMPRESSION: 1. Trace thin inter hemispheric subdural hematoma measuring 2 mm, new from prior exam. No mass effect. 2. Right occipital scalp laceration without skull fracture. 3. Extensive degenerative change in the cervical spine without acute fracture or subluxation. Critical Value/emergent results were called by telephone at the time of interpretation on 12/16/2017 at 10:12 pm to Dr. Willy Eddy , who verbally acknowledged these results. Electronically Signed   By: Rubye Oaks M.D.   On: 12/16/2017 22:12    EKG: Orders placed or performed during the hospital encounter of 09/11/16  . EKG 12-Lead  . EKG 12-Lead    IMPRESSION AND PLAN: 84 year old elderly female patient with Alzheimer's dementia, hyperlipidemia, hypertension, chronic kidney disease, diabetes mellitus presented to the emergency room with fall and scalp laceration.  Admitting diagnosis 1.  Accidental fall 2.  Subdural hematoma 3.  Scalp laceration 4.  Alzheimers dementia 5.  Hypertension 6.  Hyperlipidemia Treatment plan Admit patient to medical floor DNR by CODE STATUS No aggressive measures requested as per family Supportive care Pain management  All the records are reviewed and case discussed with ED provider. Management plans discussed with the patient, family and they are in agreement.  CODE STATUS:DNR Code Status History    Date Active Date Inactive Code Status Order ID Comments User Context   02/17/2016 15:27 02/20/2016 17:50 Full Code [id number]  Altamese Dilling, MD Inpatient    Advance Directive Documentation     Most Recent Value  Type of Advance Directive  Out of facility DNR (pink MOST or yellow form)  Pre-existing out of facility DNR order (yellow form or pink MOST form)  Yellow form placed in chart (order not valid for inpatient use)  "MOST" Form in Place?  No data       TOTAL TIME TAKING CARE OF THIS PATIENT:  50 minutes.    Ihor Austin M.D on 12/17/2017 at 2:49 AM  Between 7am to 6pm - Pager - [phone]  After 6pm go to [email or web address] - password [email or web address]  [facility]  Office  [phone]  CC: Primary care physician; Patient, No Pcp Per

## 2017-12-17 NOTE — Progress Notes (Signed)
[employer] - [place] at [facility]                                                                                                                                                                                  Patient Demographics   Mikhaela Zaugg, is a 84 y.o. female, DOB - 05-18-1923, WUJ:[id number]  Admit date - 12/16/2017   Admitting Physician Horald Pollen, PA-C  Outpatient Primary MD for the patient is Patient, No Pcp Per   LOS - 0  Subjective: Patient admitted after a fall and subdural hematoma Son at bedside  Patient currently drowsy  Review of Systems:   CONSTITUTIONAL: Drowsy  Vitals:   Vitals:   12/17/17 0200 12/17/17 0302 12/17/17 1100 12/17/17 1307  BP: (!) 144/97 (!) 146/57  106/83  Pulse: 100 64  62  Resp: 16 16  16   Temp:  (!) 97.5 F (36.4 C)  (!) 97.5 F (36.4 C)  TempSrc:  Oral  Oral  SpO2: 90% 95%  91%  Weight:  169 lb 1.6 oz (76.7 kg)    Height:   5\' 4"  (1.626 m)     Wt Readings from Last 3 Encounters:  12/17/17 169 lb 1.6 oz (76.7 kg)  09/25/17 175 lb (79.4 kg)  09/11/16 172 lb 9.6 oz (78.3 kg)    No intake or output data in the 24 hours ending 12/17/17 1452  Physical Exam:   GENERAL: Pleasant-appearing in no apparent distress.  HEAD, EYES, EARS, NOSE AND THROAT: Atraumatic, normocephalic.Pupils equal and reactive to light. Sclerae anicteric. No conjunctival injection. No oro-pharyngeal erythema.  NECK: Supple. There is no jugular venous distention. No bruits, no lymphadenopathy, no thyromegaly.  HEART: Regular rate and rhythm,. No murmurs, no rubs, no clicks.  LUNGS: Clear to auscultation bilaterally. No rales or rhonchi. No wheezes.  ABDOMEN: Soft, flat, nontender, nondistended. Has good bowel sounds. No hepatosplenomegaly appreciated.  EXTREMITIES: No evidence of any cyanosis, clubbing, or peripheral edema.  +2 pedal and radial pulses bilaterally.  NEUROLOGIC: Drowsy  sKIN: Moist and warm with no rashes appreciated.   Psych: Not anxious, depressed LN: No inguinal LN enlargement    Antibiotics   Anti-infectives (From admission, onward)   None      Medications   Scheduled Meds: . docusate sodium  100 mg Oral BID  . pantoprazole  40 mg Oral Daily   Continuous Infusions: PRN Meds:.acetaminophen, bisacodyl, mirtazapine, ondansetron **OR** ondansetron (ZOFRAN) IV, traZODone   Data Review:   Micro Results Recent Results (from the past 240 hour(s))  MRSA PCR Screening     Status: None   Collection Time: 12/17/17  3:09 AM  Result Value Ref Range Status  MRSA by PCR NEGATIVE NEGATIVE Final    Comment:        The GeneXpert MRSA Assay (FDA approved for NASAL specimens only), is one component of a comprehensive MRSA colonization surveillance program. It is not intended to diagnose MRSA infection nor to guide or monitor treatment for MRSA infections. Performed at [facility], [address] [place], [place] [address]     Radiology Reports Ct Head Wo Contrast  Result Date: 12/16/2017 CLINICAL DATA:  Dementia patient post mechanical fall with head trauma and headache. Struck wall when falling. Laceration to posterior head. EXAM: CT HEAD WITHOUT CONTRAST CT CERVICAL SPINE WITHOUT CONTRAST TECHNIQUE: Multidetector CT imaging of the head and cervical spine was performed following the standard protocol without intravenous contrast. Multiplanar CT image reconstructions of the cervical spine were also generated. COMPARISON:  Head CT 09/22/2017. Head and cervical spine CT 09/11/2016 FINDINGS: CT HEAD FINDINGS Brain: Suspect trace inter hemispheric subdural hemorrhage on the falx measuring 2 mm in maximal thickness. No mass effect. No additional intracranial hemorrhage. Stable atrophy and chronic small vessel ischemia. Cavum septum pellucidum, normal variant. No evidence of acute ischemia. Vascular: No hyperdense vessel.  Skullbase atherosclerosis. Skull: No skull fracture. Sinuses/Orbits:  Chronic left maxillary sinus opacification with hyperdense contents. Mild mucosal thickening of ethmoid air cells. Other: Right occipital scalp laceration. CT CERVICAL SPINE FINDINGS Alignment: Unchanged from prior exam. Multilevel degenerative anterolisthesis, stable. No evidence of traumatic subluxation. Skull base and vertebrae: No acute fracture. Vertebral body heights are maintained. Pannus at C1-C2, unchanged. The dens and skull base are intact. Chronic calcifications adjacent to C7 transverse process, likely sequela of remote injury. Soft tissues and spinal canal: No prevertebral fluid or swelling. No visible canal hematoma. Disc levels: Advanced diffuse disc space narrowing and endplate spurring, stable. Advanced multilevel facet arthropathy. Upper chest: No acute finding. Other: Carotid calcifications. IMPRESSION: 1. Trace thin inter hemispheric subdural hematoma measuring 2 mm, new from prior exam. No mass effect. 2. Right occipital scalp laceration without skull fracture. 3. Extensive degenerative change in the cervical spine without acute fracture or subluxation. Critical Value/emergent results were called by telephone at the time of interpretation on 12/16/2017 at 10:12 pm to Dr. Willy Eddy , who verbally acknowledged these results. Electronically Signed   By: Rubye Oaks M.D.   On: 12/16/2017 22:12   Ct Cervical Spine Wo Contrast  Result Date: 12/16/2017 CLINICAL DATA:  Dementia patient post mechanical fall with head trauma and headache. Struck wall when falling. Laceration to posterior head. EXAM: CT HEAD WITHOUT CONTRAST CT CERVICAL SPINE WITHOUT CONTRAST TECHNIQUE: Multidetector CT imaging of the head and cervical spine was performed following the standard protocol without intravenous contrast. Multiplanar CT image reconstructions of the cervical spine were also generated. COMPARISON:  Head CT 09/22/2017. Head and cervical spine CT 09/11/2016 FINDINGS: CT HEAD FINDINGS Brain: Suspect  trace inter hemispheric subdural hemorrhage on the falx measuring 2 mm in maximal thickness. No mass effect. No additional intracranial hemorrhage. Stable atrophy and chronic small vessel ischemia. Cavum septum pellucidum, normal variant. No evidence of acute ischemia. Vascular: No hyperdense vessel.  Skullbase atherosclerosis. Skull: No skull fracture. Sinuses/Orbits: Chronic left maxillary sinus opacification with hyperdense contents. Mild mucosal thickening of ethmoid air cells. Other: Right occipital scalp laceration. CT CERVICAL SPINE FINDINGS Alignment: Unchanged from prior exam. Multilevel degenerative anterolisthesis, stable. No evidence of traumatic subluxation. Skull base and vertebrae: No acute fracture. Vertebral body heights are maintained. Pannus at C1-C2, unchanged. The dens and skull base are intact.  Chronic calcifications adjacent to C7 transverse process, likely sequela of remote injury. Soft tissues and spinal canal: No prevertebral fluid or swelling. No visible canal hematoma. Disc levels: Advanced diffuse disc space narrowing and endplate spurring, stable. Advanced multilevel facet arthropathy. Upper chest: No acute finding. Other: Carotid calcifications. IMPRESSION: 1. Trace thin inter hemispheric subdural hematoma measuring 2 mm, new from prior exam. No mass effect. 2. Right occipital scalp laceration without skull fracture. 3. Extensive degenerative change in the cervical spine without acute fracture or subluxation. Critical Value/emergent results were called by telephone at the time of interpretation on 12/16/2017 at 10:12 pm to Dr. Willy EddyPATRICK ROBINSON , who verbally acknowledged these results. Electronically Signed   By: Rubye OaksMelanie  Ehinger M.D.   On: 12/16/2017 22:12     CBC Recent Labs  Lab 12/16/17 2135 12/17/17 0524  WBC 9.7 9.9  HGB 13.5 12.9  HCT 40.7 37.9  PLT 195 178  MCV 94.9 94.1  MCH 31.4 32.1  MCHC 33.1 34.1  RDW 12.6 12.6  LYMPHSABS 2.7  --   MONOABS 0.8  --   EOSABS  0.3  --   BASOSABS 0.1  --     Chemistries  Recent Labs  Lab 12/16/17 2135 12/17/17 0524  NA 137 140  K 3.7 3.8  CL 97* 99*  CO2 31 32  GLUCOSE 90 108*  BUN 32* 25*  CREATININE 1.09* 0.93  CALCIUM 8.8* 9.0   ------------------------------------------------------------------------------------------------------------------ estimated creatinine clearance is 38.7 mL/min (by C-G formula based on SCr of 0.93 mg/dL). ------------------------------------------------------------------------------------------------------------------ No results for input(s): HGBA1C in the last 72 hours. ------------------------------------------------------------------------------------------------------------------ No results for input(s): CHOL, HDL, LDLCALC, TRIG, CHOLHDL, LDLDIRECT in the last 72 hours. ------------------------------------------------------------------------------------------------------------------ No results for input(s): TSH, T4TOTAL, T3FREE, THYROIDAB in the last 72 hours.  Invalid input(s): FREET3 ------------------------------------------------------------------------------------------------------------------ No results for input(s): VITAMINB12, FOLATE, FERRITIN, TIBC, IRON, RETICCTPCT in the last 72 hours.  Coagulation profile No results for input(s): INR, PROTIME in the last 168 hours.  No results for input(s): DDIMER in the last 72 hours.  Cardiac Enzymes No results for input(s): CKMB, TROPONINI, MYOGLOBIN in the last 168 hours.  Invalid input(s): CK ------------------------------------------------------------------------------------------------------------------ Invalid input(s): POCBNP    Assessment & Plan  Patient is 84 year old status post fall with subdural hematoma 1.  Subdural hematoma status post fall supportive care, discussed with the family they would like to know if there is expansion of the hematoma we will repeat a CT scan tomorrow 2. S/p fall patient  nonambulatory it is unclear why she fell 3.  Scalp laceration due to fall 4.  Alzheimers dementia advance use as needed medications for agitation 5.  Hypertension resume Maxide 6.  Hyperlipidemia not on any medications         Code Status Orders  (From admission, onward)        Start     Ordered   12/17/17 0253  Do not attempt resuscitation (DNR)  Continuous    Question Answer Comment  In the event of cardiac or respiratory ARREST Do not call a "code blue"   In the event of cardiac or respiratory ARREST Do not perform Intubation, CPR, defibrillation or ACLS   In the event of cardiac or respiratory ARREST Use medication by any route, position, wound care, and other measures to relive pain and suffering. May use oxygen, suction and manual treatment of airway obstruction as needed for comfort.      12/17/17 0252    Code Status History    Date Active Date Inactive  Code Status Order ID Comments User Context   02/17/2016 15:27 02/20/2016 17:50 Full Code [id number]  Altamese Dilling, MD Inpatient    Advance Directive Documentation     Most Recent Value  Type of Advance Directive  Healthcare Power of Attorney, Living will  Pre-existing out of facility DNR order (yellow form or pink MOST form)  Yellow form placed in chart (order not valid for inpatient use)  "MOST" Form in Place?  No data           Consults none  DVT Prophylaxis SCDs  Lab Results  Component Value Date   PLT 178 12/17/2017     Time Spent in minutes   45 minutes greater than 50% of time spent in care coordination and counseling patient regarding the condition and plan of care.   Auburn Bilberry M.D on 12/17/2017 at 2:52 PM  Between 7am to 6pm - Pager - [phone]  After 6pm go to [email or web address] - password [email or web address] [facility]  [facility]   Office  [phone]

## 2017-12-18 ENCOUNTER — Inpatient Hospital Stay: Payer: Medicare Other

## 2017-12-18 LAB — GLUCOSE, CAPILLARY
GLUCOSE-CAPILLARY: 88 mg/dL (ref 65–99)
Glucose-Capillary: 156 mg/dL — ABNORMAL HIGH (ref 65–99)

## 2017-12-18 NOTE — Progress Notes (Signed)
[facility] [place] [facility]                                                                                                                                                                                  Patient Demographics   Caroline Wilkerson, is a 84 y.o. female, DOB - July 19, 1923, ZOX:[id number]  Admit date - 12/16/2017   Admitting Physician Horald Pollen, PA-C  Outpatient Primary MD for the patient is Patient, No Pcp Per   LOS - 1  Subjective: Patient continues to be drowsy, son was able to feed her this morning Review of Systems:   CONSTITUTIONAL: Drowsy  Vitals:   Vitals:   12/17/17 1307 12/17/17 2056 12/18/17 0553 12/18/17 1244  BP: 106/83 99/72 (!) 137/56 (!) 110/55  Pulse: 62 78 65 76  Resp: 16 18 18 16   Temp: (!) 97.5 F (36.4 C) 98 F (36.7 C) 98.1 F (36.7 C) 98.2 F (36.8 C)  TempSrc: Oral Oral Oral Oral  SpO2: 91% 90% 92% 91%  Weight:   170 lb 4.8 oz (77.2 kg)   Height:        Wt Readings from Last 3 Encounters:  12/18/17 170 lb 4.8 oz (77.2 kg)  09/25/17 175 lb (79.4 kg)  09/11/16 172 lb 9.6 oz (78.3 kg)    No intake or output data in the 24 hours ending 12/18/17 1411  Physical Exam:   GENERAL: Pleasant-appearing in no apparent distress.  HEAD, EYES, EARS, NOSE AND THROAT: Atraumatic, normocephalic.Pupils equal and reactive to light. Sclerae anicteric. No conjunctival injection. No oro-pharyngeal erythema.  NECK: Supple. There is no jugular venous distention. No bruits, no lymphadenopathy, no thyromegaly.  HEART: Regular rate and rhythm,. No murmurs, no rubs, no clicks.  LUNGS: Clear to auscultation bilaterally. No rales or rhonchi. No wheezes.  ABDOMEN: Soft, flat, nontender, nondistended. Has good bowel sounds. No hepatosplenomegaly appreciated.  EXTREMITIES: No evidence of any cyanosis, clubbing, or peripheral edema.  +2 pedal and radial pulses bilaterally.  NEUROLOGIC: Drowsy  sKIN: Moist and warm with no rashes appreciated.   Psych: Not anxious, depressed LN: No inguinal LN enlargement    Antibiotics   Anti-infectives (From admission, onward)   None      Medications   Scheduled Meds: . docusate sodium  100 mg Oral BID  . pantoprazole  40 mg Oral Daily   Continuous Infusions: PRN Meds:.acetaminophen, bisacodyl, mirtazapine, ondansetron **OR** ondansetron (ZOFRAN) IV, traZODone   Data Review:   Micro Results Recent Results (from the past 240 hour(s))  MRSA PCR Screening     Status: None   Collection Time: 12/17/17  3:09 AM  Result Value Ref Range Status  MRSA by PCR NEGATIVE NEGATIVE Final    Comment:        The GeneXpert MRSA Assay (FDA approved for NASAL specimens only), is one component of a comprehensive MRSA colonization surveillance program. It is not intended to diagnose MRSA infection nor to guide or monitor treatment for MRSA infections. Performed at [facility], [address] [place], [place] [address]     Radiology Reports Ct Head Wo Contrast  Result Date: 12/18/2017 CLINICAL DATA:  Continued surveillance of subdural hematoma sustained after a fall. Severe Alzheimer's dementia. EXAM: CT HEAD WITHOUT CONTRAST TECHNIQUE: Contiguous axial images were obtained from the base of the skull through the vertex without intravenous contrast. COMPARISON:  12/16/2017. FINDINGS: Brain: Extensive cerebral volume loss, ex vacuo hydrocephalus, white matter hypoattenuation, without parenchymal hemorrhage. Subtle hyperattenuation of the interhemispheric subdural space representing minor closed head injury/acute SDH, improved on today's exam, less than 2 mm thickness. Vascular: Calcification of the cavernous internal carotid arteries consistent with cerebrovascular atherosclerotic disease. No signs of intracranial large vessel occlusion. Skull: No skull fracture all. Sinuses/Orbits: Chronic LEFT maxillary sinus opacification with hyperdense contents, stable. Other: RIGHT occipital scalp  laceration has been stapled. IMPRESSION: Interval stability (and improvement) involving closed head injury in this patient with severe atrophy. Interhemispheric subdural hematoma measures less than 2 mm thickness, without new areas of concern. See discussion above. Electronically Signed   By: Elsie StainJohn T Curnes M.D.   On: 12/18/2017 09:29   Ct Head Wo Contrast  Result Date: 12/16/2017 CLINICAL DATA:  Dementia patient post mechanical fall with head trauma and headache. Struck wall when falling. Laceration to posterior head. EXAM: CT HEAD WITHOUT CONTRAST CT CERVICAL SPINE WITHOUT CONTRAST TECHNIQUE: Multidetector CT imaging of the head and cervical spine was performed following the standard protocol without intravenous contrast. Multiplanar CT image reconstructions of the cervical spine were also generated. COMPARISON:  Head CT 09/22/2017. Head and cervical spine CT 09/11/2016 FINDINGS: CT HEAD FINDINGS Brain: Suspect trace inter hemispheric subdural hemorrhage on the falx measuring 2 mm in maximal thickness. No mass effect. No additional intracranial hemorrhage. Stable atrophy and chronic small vessel ischemia. Cavum septum pellucidum, normal variant. No evidence of acute ischemia. Vascular: No hyperdense vessel.  Skullbase atherosclerosis. Skull: No skull fracture. Sinuses/Orbits: Chronic left maxillary sinus opacification with hyperdense contents. Mild mucosal thickening of ethmoid air cells. Other: Right occipital scalp laceration. CT CERVICAL SPINE FINDINGS Alignment: Unchanged from prior exam. Multilevel degenerative anterolisthesis, stable. No evidence of traumatic subluxation. Skull base and vertebrae: No acute fracture. Vertebral body heights are maintained. Pannus at C1-C2, unchanged. The dens and skull base are intact. Chronic calcifications adjacent to C7 transverse process, likely sequela of remote injury. Soft tissues and spinal canal: No prevertebral fluid or swelling. No visible canal hematoma. Disc  levels: Advanced diffuse disc space narrowing and endplate spurring, stable. Advanced multilevel facet arthropathy. Upper chest: No acute finding. Other: Carotid calcifications. IMPRESSION: 1. Trace thin inter hemispheric subdural hematoma measuring 2 mm, new from prior exam. No mass effect. 2. Right occipital scalp laceration without skull fracture. 3. Extensive degenerative change in the cervical spine without acute fracture or subluxation. Critical Value/emergent results were called by telephone at the time of interpretation on 12/16/2017 at 10:12 pm to Dr. Willy EddyPATRICK ROBINSON , who verbally acknowledged these results. Electronically Signed   By: Rubye OaksMelanie  Ehinger M.D.   On: 12/16/2017 22:12   Ct Cervical Spine Wo Contrast  Result Date: 12/16/2017 CLINICAL DATA:  Dementia patient post mechanical fall with head trauma and  headache. Struck wall when falling. Laceration to posterior head. EXAM: CT HEAD WITHOUT CONTRAST CT CERVICAL SPINE WITHOUT CONTRAST TECHNIQUE: Multidetector CT imaging of the head and cervical spine was performed following the standard protocol without intravenous contrast. Multiplanar CT image reconstructions of the cervical spine were also generated. COMPARISON:  Head CT 09/22/2017. Head and cervical spine CT 09/11/2016 FINDINGS: CT HEAD FINDINGS Brain: Suspect trace inter hemispheric subdural hemorrhage on the falx measuring 2 mm in maximal thickness. No mass effect. No additional intracranial hemorrhage. Stable atrophy and chronic small vessel ischemia. Cavum septum pellucidum, normal variant. No evidence of acute ischemia. Vascular: No hyperdense vessel.  Skullbase atherosclerosis. Skull: No skull fracture. Sinuses/Orbits: Chronic left maxillary sinus opacification with hyperdense contents. Mild mucosal thickening of ethmoid air cells. Other: Right occipital scalp laceration. CT CERVICAL SPINE FINDINGS Alignment: Unchanged from prior exam. Multilevel degenerative anterolisthesis, stable. No  evidence of traumatic subluxation. Skull base and vertebrae: No acute fracture. Vertebral body heights are maintained. Pannus at C1-C2, unchanged. The dens and skull base are intact. Chronic calcifications adjacent to C7 transverse process, likely sequela of remote injury. Soft tissues and spinal canal: No prevertebral fluid or swelling. No visible canal hematoma. Disc levels: Advanced diffuse disc space narrowing and endplate spurring, stable. Advanced multilevel facet arthropathy. Upper chest: No acute finding. Other: Carotid calcifications. IMPRESSION: 1. Trace thin inter hemispheric subdural hematoma measuring 2 mm, new from prior exam. No mass effect. 2. Right occipital scalp laceration without skull fracture. 3. Extensive degenerative change in the cervical spine without acute fracture or subluxation. Critical Value/emergent results were called by telephone at the time of interpretation on 12/16/2017 at 10:12 pm to Dr. Willy Eddy , who verbally acknowledged these results. Electronically Signed   By: Rubye Oaks M.D.   On: 12/16/2017 22:12     CBC Recent Labs  Lab 12/16/17 2135 12/17/17 0524  WBC 9.7 9.9  HGB 13.5 12.9  HCT 40.7 37.9  PLT 195 178  MCV 94.9 94.1  MCH 31.4 32.1  MCHC 33.1 34.1  RDW 12.6 12.6  LYMPHSABS 2.7  --   MONOABS 0.8  --   EOSABS 0.3  --   BASOSABS 0.1  --     Chemistries  Recent Labs  Lab 12/16/17 2135 12/17/17 0524  NA 137 140  K 3.7 3.8  CL 97* 99*  CO2 31 32  GLUCOSE 90 108*  BUN 32* 25*  CREATININE 1.09* 0.93  CALCIUM 8.8* 9.0   ------------------------------------------------------------------------------------------------------------------ estimated creatinine clearance is 38.8 mL/min (by C-G formula based on SCr of 0.93 mg/dL). ------------------------------------------------------------------------------------------------------------------ No results for input(s): HGBA1C in the last 72  hours. ------------------------------------------------------------------------------------------------------------------ No results for input(s): CHOL, HDL, LDLCALC, TRIG, CHOLHDL, LDLDIRECT in the last 72 hours. ------------------------------------------------------------------------------------------------------------------ No results for input(s): TSH, T4TOTAL, T3FREE, THYROIDAB in the last 72 hours.  Invalid input(s): FREET3 ------------------------------------------------------------------------------------------------------------------ No results for input(s): VITAMINB12, FOLATE, FERRITIN, TIBC, IRON, RETICCTPCT in the last 72 hours.  Coagulation profile No results for input(s): INR, PROTIME in the last 168 hours.  No results for input(s): DDIMER in the last 72 hours.  Cardiac Enzymes No results for input(s): CKMB, TROPONINI, MYOGLOBIN in the last 168 hours.  Invalid input(s): CK ------------------------------------------------------------------------------------------------------------------ Invalid input(s): POCBNP    Assessment & Plan  Patient is 84 year old status post fall with subdural hematoma 1.  Subdural hematoma status post fall supportive care, repeat CT scan shows a abosrbtion, patient remains sleepy 2. S/p fall patient nonambulatory it is unclear why she fell 3.  Scalp laceration due to  fall 4.  Alzheimers dementia advance use as needed medications for agitation 5.  Hypertension continue Maxzide 6.  Hyperlipidemia not on any medications    I discussed with the son regarding disposition: Patient may need to go to skilled nursing facility with hospice following her sure workers already following the patient     Code Status Orders  (From admission, onward)        Start     Ordered   12/17/17 0253  Do not attempt resuscitation (DNR)  Continuous    Question Answer Comment  In the event of cardiac or respiratory ARREST Do not call a "code blue"   In the  event of cardiac or respiratory ARREST Do not perform Intubation, CPR, defibrillation or ACLS   In the event of cardiac or respiratory ARREST Use medication by any route, position, wound care, and other measures to relive pain and suffering. May use oxygen, suction and manual treatment of airway obstruction as needed for comfort.      12/17/17 0252    Code Status History    Date Active Date Inactive Code Status Order ID Comments User Context   02/17/2016 15:27 02/20/2016 17:50 Full Code [id number]  Altamese Dilling, MD Inpatient    Advance Directive Documentation     Most Recent Value  Type of Advance Directive  Healthcare Power of Attorney, Living will  Pre-existing out of facility DNR order (yellow form or pink MOST form)  Yellow form placed in chart (order not valid for inpatient use)  "MOST" Form in Place?  No data           Consults none  DVT Prophylaxis SCDs  Lab Results  Component Value Date   PLT 178 12/17/2017     Time Spent in minutes   greater than 50% of time spent in care coordination and counseling patient regarding the condition and plan of care.   Auburn Bilberry M.D on 12/18/2017 at 2:11 PM  Between 7am to 6pm - Pager - [phone]  After 6pm go to [email or web address] - password [email or web address] [facility]  [facility] [place] [facility]   Office  [phone]

## 2017-12-19 ENCOUNTER — Telehealth: Payer: Self-pay | Admitting: Family Medicine

## 2017-12-19 DIAGNOSIS — F0280 Dementia in other diseases classified elsewhere without behavioral disturbance: Secondary | ICD-10-CM

## 2017-12-19 DIAGNOSIS — G309 Alzheimer's disease, unspecified: Principal | ICD-10-CM

## 2017-12-19 LAB — GLUCOSE, CAPILLARY: Glucose-Capillary: 96 mg/dL (ref 65–99)

## 2017-12-19 MED ORDER — FLEET ENEMA 7-19 GM/118ML RE ENEM
1.0000 | ENEMA | RECTAL | Status: AC
  Administered 2017-12-19: 1 via RECTAL

## 2017-12-19 MED ORDER — MELOXICAM 15 MG PO TABS: 7.5000 mg | ORAL_TABLET | Freq: Two times a day (BID) | ORAL | 0 refills | Status: AC

## 2017-12-19 MED ORDER — SENNOSIDES-DOCUSATE SODIUM 8.6-50 MG PO TABS
2.0000 | ORAL_TABLET | ORAL | Status: AC
  Administered 2017-12-19: 2 via ORAL
  Filled 2017-12-19: qty 2

## 2017-12-19 MED ORDER — MELOXICAM 15 MG PO TABS
7.5000 mg | ORAL_TABLET | Freq: Every day | ORAL | Status: DC
  Administered 2017-12-19: 7.5 mg via ORAL
  Filled 2017-12-19: qty 1

## 2017-12-19 NOTE — Care Management Important Message (Signed)
Important Message  Patient Details  Name: Caroline Wilkerson MRN: [id number] Date of Birth: Jan 26, 1923   Medicare Important Message Given:  Yes    Chapman FitchBOWEN, Aemilia Dedrick T, RN 12/19/2017, 10:33 AM

## 2017-12-19 NOTE — Clinical Social Work Note (Signed)
CSW informed by MD that patient can return to [facility] today with resumption of [facility]. RN CM to notify Hospice. Patient's son is aware of patient's discharge. Patient will transport via EMS. CSW spoke with Dois DavenportSandra at [facility] who stated that patient can return. Discharge information sent.  York SpanielMonica Terence Bart MSW,LCSW [phone]

## 2017-12-19 NOTE — Telephone Encounter (Signed)
Okay to place new hospice orders? Not seen in over 1 year. Please advise.

## 2017-12-19 NOTE — Progress Notes (Signed)
Attempted to call report x3. Pt had BM. IV removed, son and daughter aware of discharge. EMS called for transport.

## 2017-12-19 NOTE — Care Management (Signed)
Caroline Wilkerson with [facility] notified of discharge.

## 2017-12-19 NOTE — Telephone Encounter (Signed)
yes

## 2017-12-19 NOTE — Telephone Encounter (Signed)
Sherry with Hospice called and would like to have orders sent over to readmit the pt to hospice. Fax number is [phone].

## 2017-12-19 NOTE — [place] FL2 (Signed)
[place] MEDICAID FL2 LEVEL OF CARE SCREENING TOOL     IDENTIFICATION  Patient Name: Caroline Wilkerson Birthdate: September 04, 1923 Sex: female Admission Date (Current Location): 12/16/2017  [facility] and [place] Number:  Chiropodist and Address:  [facility], [address], [place], [place] [address]      Provider Number: [id number]  Attending Physician Name and Address:  Delfino Lovett, MD  Relative Name and Phone Number:       Current Level of Care: Hospital Recommended Level of Care: Assisted Living Facility Prior Approval Number:    Date Approved/Denied:   PASRR Number:    Discharge Plan: Domiciliary (Rest home)    Current Diagnoses: Patient Active Problem List   Diagnosis Date Noted  . SDH (subdural hematoma) (HCC) 12/17/2017  . Cellulitis 02/17/2016  . Generalized weakness 02/17/2016  . Decubitus ulcer of buttock, stage 1 10/21/2015  . Hypertension   . Hyperlipidemia   . Alzheimer's dementia     Orientation RESPIRATION BLADDER Height & Weight     Self  Normal Incontinent Weight: 179 lb 9.6 oz (81.5 kg) Height:  5\' 4"  (162.6 cm)  BEHAVIORAL SYMPTOMS/MOOD NEUROLOGICAL BOWEL NUTRITION STATUS  (none) (none) Incontinent Diet(regular)  AMBULATORY STATUS COMMUNICATION OF NEEDS Skin   Total Care Verbally Normal                       Personal Care Assistance Level of Assistance  Total care       Total Care Assistance: Maximum assistance   Functional Limitations Info             SPECIAL CARE FACTORS FREQUENCY                       Contractures Contractures Info: Not present    Additional Factors Info  Code Status Code Status Info: dnr             DISCHARGE MEDICATIONS:       Allergies as of 12/19/2017      Reactions   Codeine Diarrhea, Nausea And Vomiting              Medication List     STOP taking these medications   azithromycin 250 MG tablet Commonly known as:  ZITHROMAX    clindamycin 300 MG capsule Commonly known as:  CLEOCIN   guaiFENesin 600 MG 12 hr tablet Commonly known as:  MUCINEX   meloxicam 7.5 MG tablet Commonly known as:  MOBIC   omeprazole 20 MG capsule Commonly known as:  PRILOSEC     TAKE these medications   acetaminophen 500 MG tablet Commonly known as:  TYLENOL Take 500 mg by mouth every 6 (six) hours as needed for moderate pain.   ASPERCREME LIDOCAINE 4 % Ptch Generic drug:  Lidocaine Apply 1 patch topically daily. To affected area of back for 12 hours, off for 12 hours   calcium carbonate 600 MG Tabs tablet Commonly known as:  OS-CAL Take 600 mg by mouth 2 (two) times daily with a meal.   citalopram 10 MG tablet Commonly known as:  CELEXA Take 10 mg by mouth daily.   fexofenadine 180 MG tablet Commonly known as:  ALLEGRA Take 180 mg by mouth every morning.   mirabegron ER 25 MG Tb24 tablet Commonly known as:  MYRBETRIQ Take 1 tablet (25 mg total) by mouth daily.   mirtazapine 15 MG disintegrating tablet Commonly known as:  REMERON SOL-TAB Take 1  tablet (15 mg total) by mouth at bedtime as needed (use for sleep and combatave behaviour).   triamterene-hydrochlorothiazide 75-50 MG tablet Commonly known as:  MAXZIDE Take 1 tablet by mouth daily.         Additional Information [facility]  [place]ica Kule [place], [place]

## 2017-12-19 NOTE — Discharge Summary (Addendum)
[facility] [place] [facility]   PATIENT NAME: Caroline Wilkerson    MR#:  [id number]  DATE OF BIRTH:  Dec 23, 1922  DATE OF ADMISSION:  12/16/2017   ADMITTING PHYSICIAN: Horald Pollen, PA-C  DATE OF DISCHARGE: 12/19/2017  PRIMARY CARE PHYSICIAN: Patient, No Pcp Per   ADMISSION DIAGNOSIS:  Injury of head, initial encounter [S09.90XA] Scalp laceration, initial encounter [S01.01XA] Subdural hematoma due to concussion, with loss of consciousness, initial encounter (HCC) [S06.5X9A] DISCHARGE DIAGNOSIS:  Active Problems:   SDH (subdural hematoma) (HCC)  SECONDARY DIAGNOSIS:   Past Medical History:  Diagnosis Date  . Alzheimer's dementia   . Arthritis   . Cardiomegaly   . Chronic kidney disease   . Diabetes mellitus without complication (HCC)   . Fibrocystic breast   . Hearing loss   . Hyperlipidemia   . Hypertension   . Vertigo    HOSPITAL COURSE:  Patient is 84 year old status post fall with subdural hematoma  1.Subdural hematoma status post fall supportive care, repeat CT scan shows a abosrbtion, patient remains sleepy 2.  s/p fall patient nonambulatory it is unclear why she fell -son reports at least 3-4 falls in a year 3.Scalp laceration due to fall 4.Alzheimersdementia: At baseline 5.Hypertension continue Maxzide 6.Hyperlipidemia not on any medications DISCHARGE CONDITIONS:  stable CONSULTS OBTAINED:   DRUG ALLERGIES:   Allergies  Allergen Reactions  . Codeine Diarrhea and Nausea And Vomiting   DISCHARGE MEDICATIONS:   Allergies as of 12/19/2017      Reactions   Codeine Diarrhea, Nausea And Vomiting      Medication List    STOP taking these medications   azithromycin 250 MG tablet Commonly known as:  ZITHROMAX   clindamycin 300 MG capsule Commonly known as:  CLEOCIN   guaiFENesin 600 MG 12 hr tablet Commonly known as:  MUCINEX   omeprazole 20 MG capsule Commonly known as:  PRILOSEC     TAKE these medications     acetaminophen 500 MG tablet Commonly known as:  TYLENOL Take 500 mg by mouth every 6 (six) hours as needed for moderate pain.   ASPERCREME LIDOCAINE 4 % Ptch Generic drug:  Lidocaine Apply 1 patch topically daily. To affected area of back for 12 hours, off for 12 hours   calcium carbonate 600 MG Tabs tablet Commonly known as:  OS-CAL Take 600 mg by mouth 2 (two) times daily with a meal.   citalopram 10 MG tablet Commonly known as:  CELEXA Take 10 mg by mouth daily.   fexofenadine 180 MG tablet Commonly known as:  ALLEGRA Take 180 mg by mouth every morning.   meloxicam 15 MG tablet Commonly known as:  MOBIC Take 0.5 tablets (7.5 mg total) by mouth 2 (two) times daily. What changed:  medication strength   mirabegron ER 25 MG Tb24 tablet Commonly known as:  MYRBETRIQ Take 1 tablet (25 mg total) by mouth daily.   mirtazapine 15 MG disintegrating tablet Commonly known as:  REMERON SOL-TAB Take 1 tablet (15 mg total) by mouth at bedtime as needed (use for sleep and combatave behaviour).   triamterene-hydrochlorothiazide 75-50 MG tablet Commonly known as:  MAXZIDE Take 1 tablet by mouth daily.      DISCHARGE INSTRUCTIONS:   DIET:  Regular diet DISCHARGE CONDITION:  fair ACTIVITY:  Activity as tolerated OXYGEN:  Home Oxygen: No.  Oxygen Delivery: room air DISCHARGE LOCATION:  [facility]   If you experience worsening of your admission symptoms, develop shortness of  breath, life threatening emergency, suicidal or homicidal thoughts you must seek medical attention immediately by calling 911 or calling your MD immediately  if symptoms less severe.  You Must read complete instructions/literature along with all the possible adverse reactions/side effects for all the Medicines you take and that have been prescribed to you. Take any new Medicines after you have completely understood and accpet all the possible adverse reactions/side effects.   Please note  You were  cared for by a hospitalist during your hospital stay. If you have any questions about your discharge medications or the care you received while you were in the hospital after you are discharged, you can call the unit and asked to speak with the hospitalist on call if the hospitalist that took care of you is not available. Once you are discharged, your primary care physician will handle any further medical issues. Please note that NO REFILLS for any discharge medications will be authorized once you are discharged, as it is imperative that you return to your primary care physician (or establish a relationship with a primary care physician if you do not have one) for your aftercare needs so that they can reassess your need for medications and monitor your lab values.    On the day of Discharge:  VITAL SIGNS:  Blood pressure (!) 141/57, pulse 63, temperature 98.2 F (36.8 C), resp. rate 18, height 5\' 4"  (1.626 m), weight 81.5 kg (179 lb 9.6 oz), SpO2 95 %. PHYSICAL EXAMINATION:  GENERAL:  84 y.o.year-old patient lying in the bed with no acute distress.  EYES: Pupils equal, round, reactive to light and accommodation. No scleral icterus. Extraocular muscles intact.  HEENT: Laceration over scalp, normocephalic. Oropharynx and nasopharynx clear.  NECK:  Supple, no jugular venous distention. No thyroid enlargement, no tenderness.  LUNGS: Normal breath sounds bilaterally, no wheezing, rales,rhonchi or crepitation. No use of accessory muscles of respiration.  CARDIOVASCULAR: S1, S2 normal. No murmurs, rubs, or gallops.  ABDOMEN: Soft, nontender, nondistended. Bowel sounds present. No organomegaly or mass.  EXTREMITIES: No pedal edema, cyanosis, or clubbing.  NEUROLOGIC: Cranial nerves II through XII are intact. Muscle strength 5/5 in all extremities. Sensation intact. Gait not checked.  Has dementia PSYCHIATRIC: could not be assessed SKIN: No obvious rash Laceration over occipital area of scalp. DATA  REVIEW:   CBC Recent Labs  Lab 12/17/17 0524  WBC 9.9  HGB 12.9  HCT 37.9  PLT 178    Chemistries  Recent Labs  Lab 12/17/17 0524  NA 140  K 3.8  CL 99*  CO2 32  GLUCOSE 108*  BUN 25*  CREATININE 0.93  CALCIUM 9.0      Contact information for follow-up providers    Schedule an appointment as soon as possible for a visit  with Keith RakeBarr, John C, MD.   Specialty:  Neurosurgery Contact information: [address] [place] [address] [phone]        [facility] EMERGENCY DEPARTMENT.   Specialty:  Emergency Medicine Why:  If symptoms worsen Contact information: [address] [id number] [place] [id number] [phone]           Contact information for after-discharge care    Destination    [facility] .   Service:  Assisted Living Contact information: [address] [place] [id number] [phone]                   RADIOLOGY:  No results found.  Management plans discussed with the patient, family (discussed with patient's son who is in agreement to take her back to her facility with hospice) and they are in agreement.  CODE STATUS: DNR   TOTAL TIME TAKING CARE OF THIS PATIENT: 45 minutes.    Delfino Lovett M.D on 12/19/2017 at 10:22 AM  Between 7am to 6pm - Pager - [phone]  After 6pm go to [email or web address] - Social research officer, government  [facility]  Office  [phone]  CC: Primary care physician; Patient, No Pcp Per   Note: This dictation was prepared with Dragon dictation along with smaller phrase technology. Any transcriptional errors that result from this process are unintentional.

## 2017-12-20 NOTE — Telephone Encounter (Signed)
Referral placed and faxed to [phone], along w/ facesheet, demographics, insurance card.

## 2018-03-02 IMAGING — CT CT HEAD W/O CM
4 of 5 series · 15 of 47 positions shown, 17 images · non-contrast
Comparison: 12/16/2017.

CLINICAL DATA: Continued surveillance of subdural hematoma
sustained after a fall. Severe Alzheimer's dementia.

EXAM:
CT HEAD WITHOUT CONTRAST
TECHNIQUE: Contiguous axial images were obtained from the base of the skull
through the vertex without intravenous contrast.

[Series 2: head wo · axial · 0.47mm/px · z∈[-105,-5]mm · 5 of 32 slices shown, 7 images]
[im 6/32  brain]
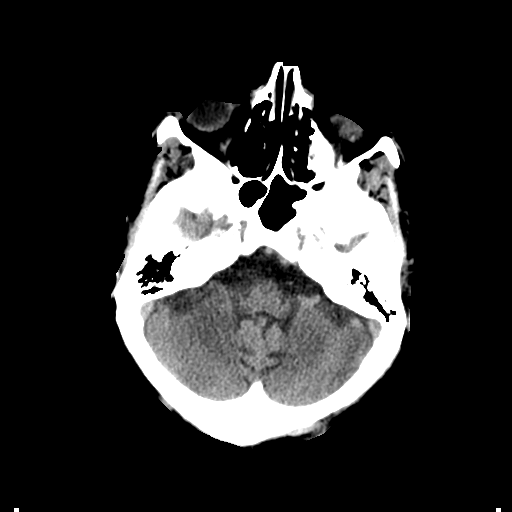
[im 6/32  bone]
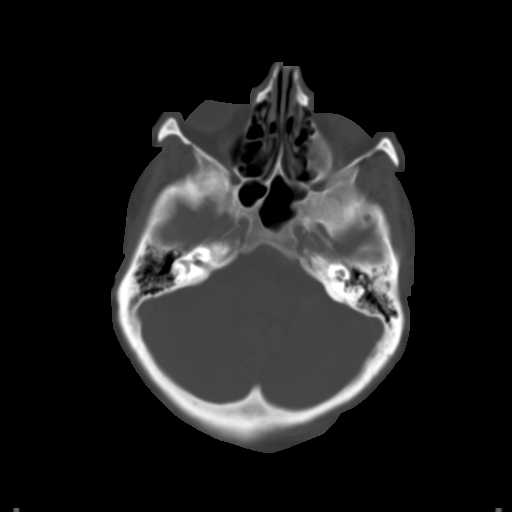
[im 11/32  brain]
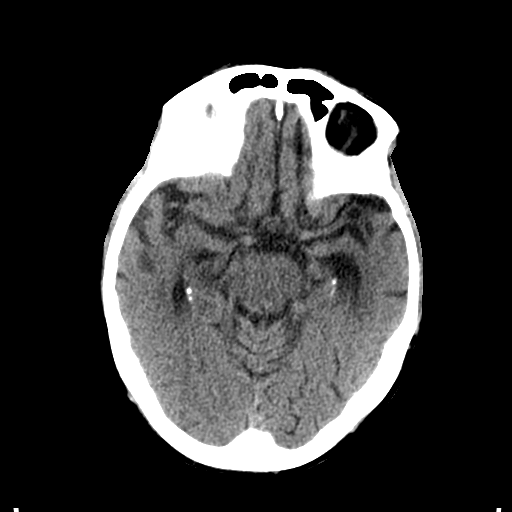
[im 16/32  brain]
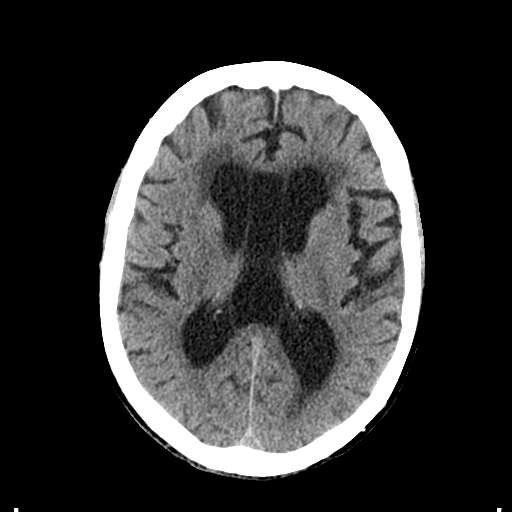
[im 21/32  brain]
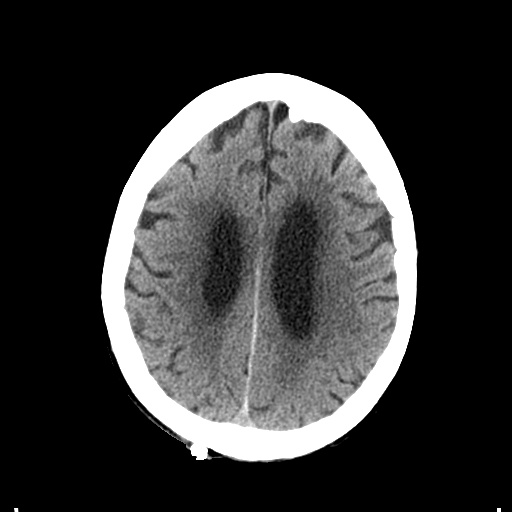
[im 26/32  brain]
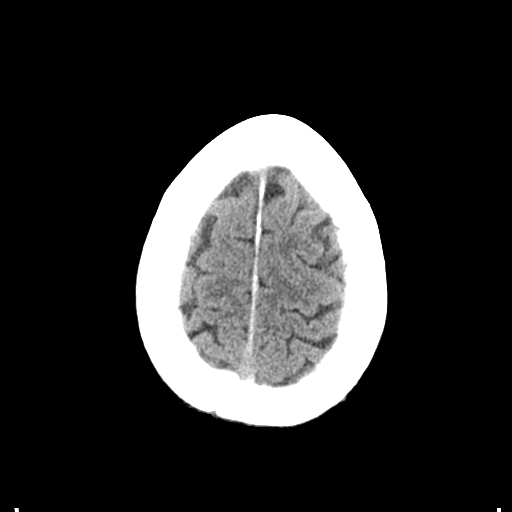
[im 26/32  bone]
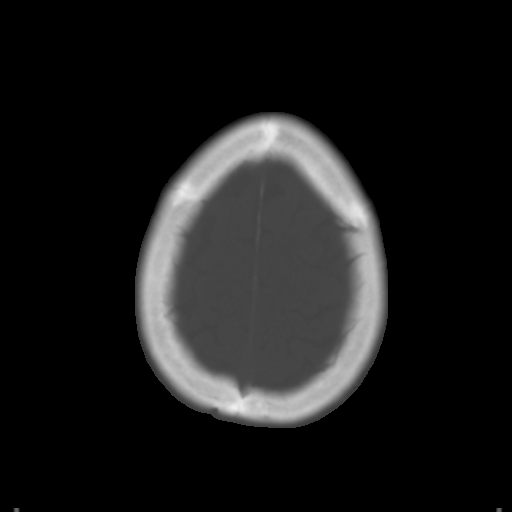

[Series 4: coronal soft tissue · coronal · 0.30mm/px · 3 of 72 slices shown]
[im 24/72  brain]
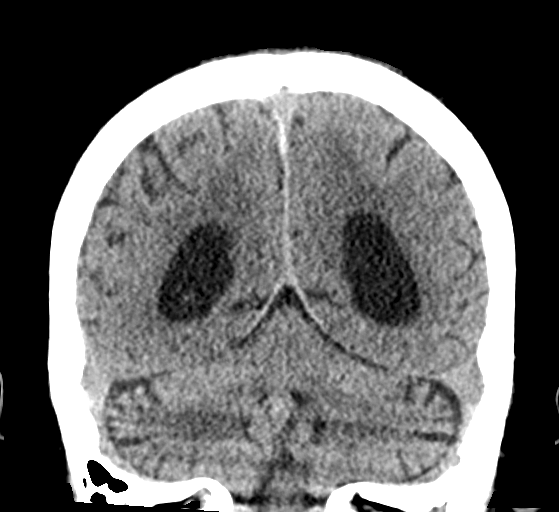
[im 32/72  brain]
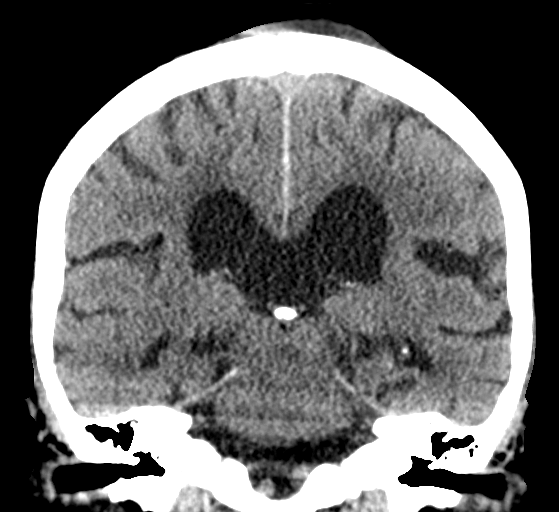
[im 40/72  brain]
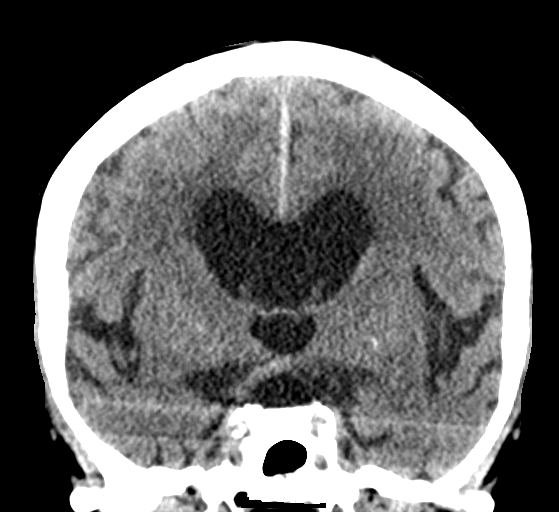

[Series 5: sagittal soft tissue · sagittal · 0.30mm/px · 3 of 54 slices shown]
[im 18/54  brain]
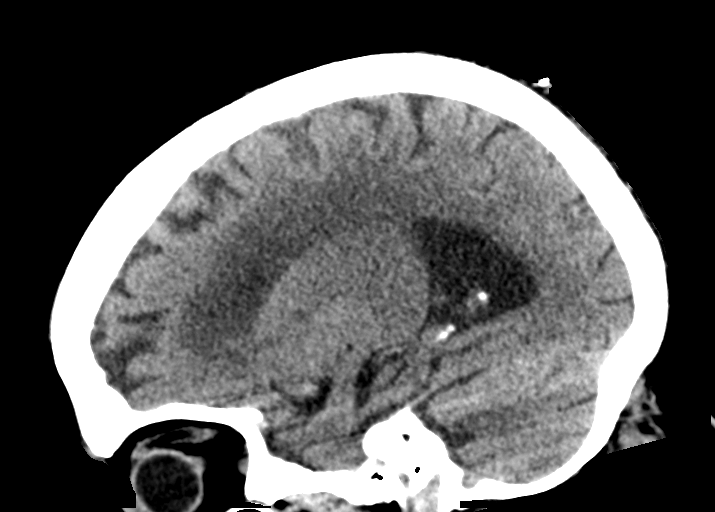
[im 27/54  brain]
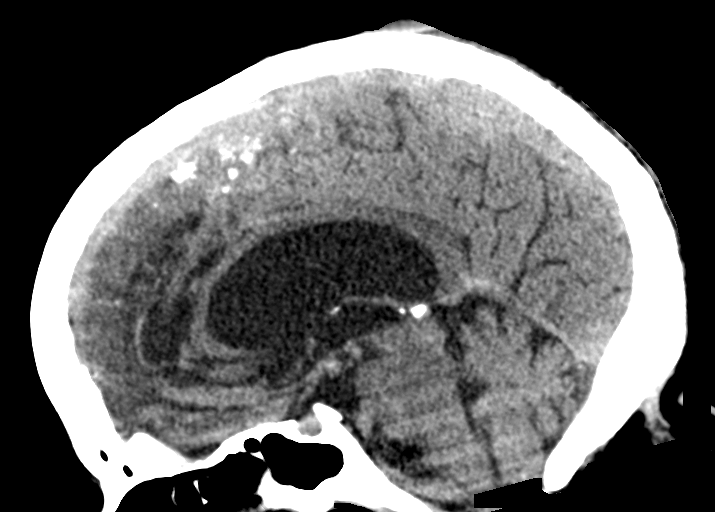
[im 36/54  brain]
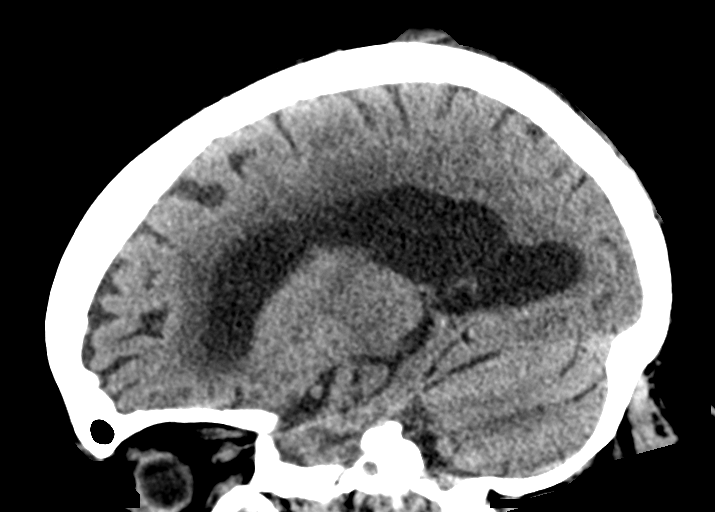

[Series 6: ax head wo recon · axial · 0.35mm/px · z∈[-72,+14]mm · 4 of 31 slices shown]
[im 7/31  brain]
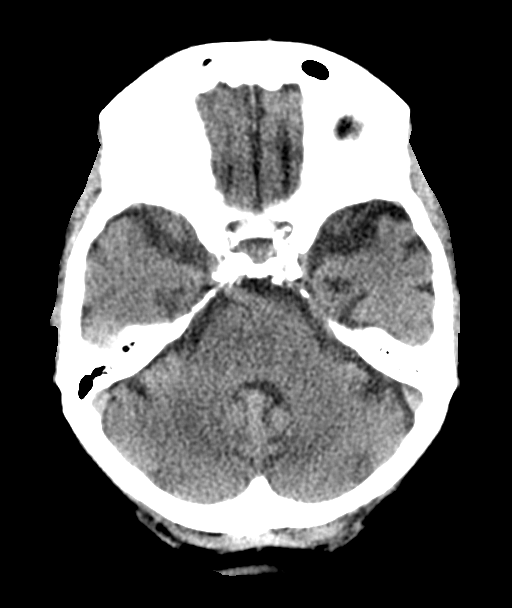
[im 13/31  brain]
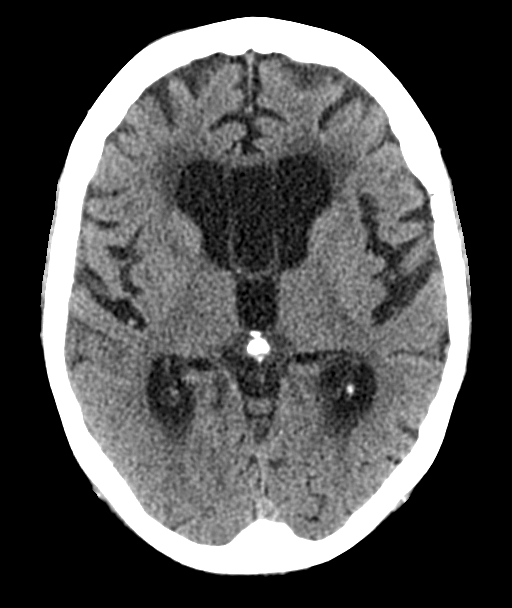
[im 19/31  brain]
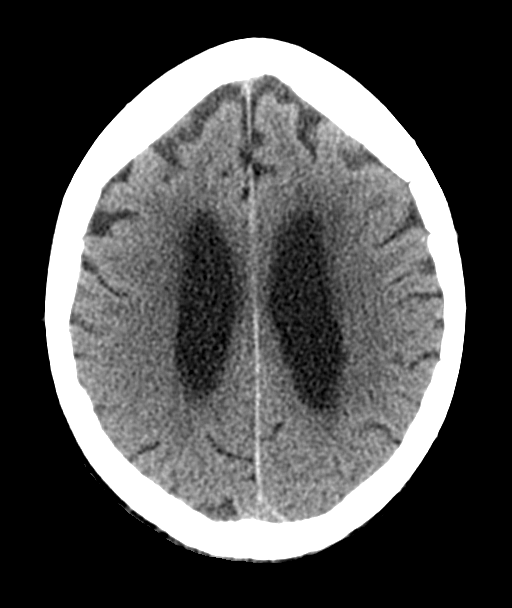
[im 25/31  brain]
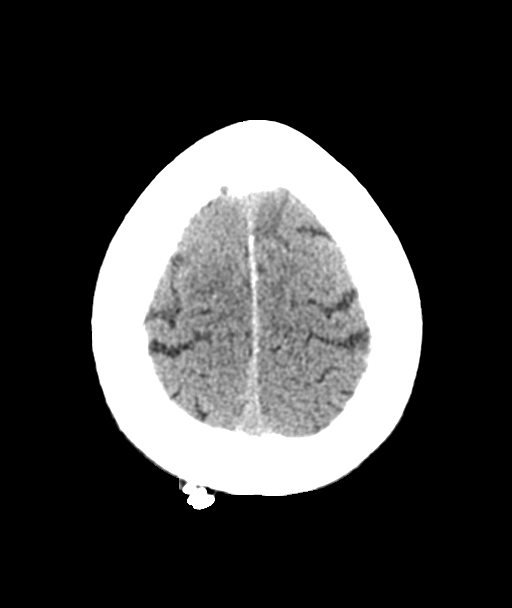

[15 of 47 positions shown; findings below may reference images not displayed]

FINDINGS: Brain: Extensive cerebral volume loss, ex vacuo hydrocephalus, white
matter hypoattenuation, without parenchymal hemorrhage. Subtle
hyperattenuation of the interhemispheric subdural space representing
minor closed head injury/acute SDH, improved on today's exam, less
than 2 mm thickness.

Vascular: Calcification of the cavernous internal carotid arteries
consistent with cerebrovascular atherosclerotic disease. No signs of
intracranial large vessel occlusion.

Skull: No skull fracture all.

Sinuses/Orbits: Chronic LEFT maxillary sinus opacification with
hyperdense contents, stable.

Other: RIGHT occipital scalp laceration has been stapled.
IMPRESSION: Interval stability (and improvement) involving closed head injury in
this patient with severe atrophy. Interhemispheric subdural hematoma
measures less than 2 mm thickness, without new areas of concern. See
discussion above.

## 2019-05-10 ENCOUNTER — Other Ambulatory Visit: Payer: Self-pay

## 2019-05-10 ENCOUNTER — Inpatient Hospital Stay
Admission: EM | Admit: 2019-05-10 | Discharge: 2019-05-15 | DRG: 481 | Disposition: A | Discharge status: Skilled Nursing Facility | Payer: Medicare Other | Source: Skilled Nursing Facility | Attending: Internal Medicine | Admitting: Internal Medicine

## 2019-05-10 ENCOUNTER — Emergency Department: Payer: Medicare Other

## 2019-05-10 ENCOUNTER — Encounter: Payer: Self-pay | Admitting: Emergency Medicine

## 2019-05-10 DIAGNOSIS — N189 Chronic kidney disease, unspecified: Secondary | ICD-10-CM | POA: Diagnosis present

## 2019-05-10 DIAGNOSIS — Z419 Encounter for procedure for purposes other than remedying health state, unspecified: Secondary | ICD-10-CM

## 2019-05-10 DIAGNOSIS — E785 Hyperlipidemia, unspecified: Secondary | ICD-10-CM | POA: Diagnosis present

## 2019-05-10 DIAGNOSIS — I129 Hypertensive chronic kidney disease with stage 1 through stage 4 chronic kidney disease, or unspecified chronic kidney disease: Secondary | ICD-10-CM | POA: Diagnosis present

## 2019-05-10 DIAGNOSIS — E1122 Type 2 diabetes mellitus with diabetic chronic kidney disease: Secondary | ICD-10-CM | POA: Diagnosis present

## 2019-05-10 DIAGNOSIS — Z885 Allergy status to narcotic agent status: Secondary | ICD-10-CM | POA: Diagnosis not present

## 2019-05-10 DIAGNOSIS — Z1159 Encounter for screening for other viral diseases: Secondary | ICD-10-CM | POA: Diagnosis not present

## 2019-05-10 DIAGNOSIS — S72441A Displaced fracture of lower epiphysis (separation) of right femur, initial encounter for closed fracture: Secondary | ICD-10-CM | POA: Diagnosis present

## 2019-05-10 DIAGNOSIS — M79604 Pain in right leg: Secondary | ICD-10-CM | POA: Diagnosis present

## 2019-05-10 DIAGNOSIS — G309 Alzheimer's disease, unspecified: Secondary | ICD-10-CM | POA: Diagnosis present

## 2019-05-10 DIAGNOSIS — D509 Iron deficiency anemia, unspecified: Secondary | ICD-10-CM | POA: Diagnosis present

## 2019-05-10 DIAGNOSIS — Z66 Do not resuscitate: Secondary | ICD-10-CM | POA: Diagnosis present

## 2019-05-10 DIAGNOSIS — Z96651 Presence of right artificial knee joint: Secondary | ICD-10-CM | POA: Diagnosis present

## 2019-05-10 DIAGNOSIS — E876 Hypokalemia: Secondary | ICD-10-CM | POA: Diagnosis present

## 2019-05-10 DIAGNOSIS — H9190 Unspecified hearing loss, unspecified ear: Secondary | ICD-10-CM | POA: Diagnosis present

## 2019-05-10 DIAGNOSIS — F0280 Dementia in other diseases classified elsewhere without behavioral disturbance: Secondary | ICD-10-CM | POA: Diagnosis present

## 2019-05-10 DIAGNOSIS — Z833 Family history of diabetes mellitus: Secondary | ICD-10-CM | POA: Diagnosis not present

## 2019-05-10 DIAGNOSIS — F329 Major depressive disorder, single episode, unspecified: Secondary | ICD-10-CM | POA: Diagnosis present

## 2019-05-10 DIAGNOSIS — Z791 Long term (current) use of non-steroidal anti-inflammatories (NSAID): Secondary | ICD-10-CM

## 2019-05-10 DIAGNOSIS — Z9049 Acquired absence of other specified parts of digestive tract: Secondary | ICD-10-CM

## 2019-05-10 DIAGNOSIS — Z79899 Other long term (current) drug therapy: Secondary | ICD-10-CM

## 2019-05-10 DIAGNOSIS — Z96641 Presence of right artificial hip joint: Secondary | ICD-10-CM | POA: Diagnosis present

## 2019-05-10 DIAGNOSIS — S7291XA Unspecified fracture of right femur, initial encounter for closed fracture: Secondary | ICD-10-CM

## 2019-05-10 DIAGNOSIS — Z8249 Family history of ischemic heart disease and other diseases of the circulatory system: Secondary | ICD-10-CM

## 2019-05-10 DIAGNOSIS — D631 Anemia in chronic kidney disease: Secondary | ICD-10-CM | POA: Diagnosis present

## 2019-05-10 DIAGNOSIS — M9711XA Periprosthetic fracture around internal prosthetic right knee joint, initial encounter: Secondary | ICD-10-CM | POA: Diagnosis present

## 2019-05-10 DIAGNOSIS — Z993 Dependence on wheelchair: Secondary | ICD-10-CM | POA: Diagnosis not present

## 2019-05-10 DIAGNOSIS — Z90710 Acquired absence of both cervix and uterus: Secondary | ICD-10-CM

## 2019-05-10 DIAGNOSIS — W19XXXA Unspecified fall, initial encounter: Secondary | ICD-10-CM | POA: Diagnosis present

## 2019-05-10 HISTORY — DX: Depression, unspecified: F32.A

## 2019-05-10 LAB — CBC
HCT: 30 % — ABNORMAL LOW (ref 36.0–46.0)
Hemoglobin: 8.6 g/dL — ABNORMAL LOW (ref 12.0–15.0)
MCH: 19.7 pg — ABNORMAL LOW (ref 26.0–34.0)
MCHC: 28.7 g/dL — ABNORMAL LOW (ref 30.0–36.0)
MCV: 68.8 fL — ABNORMAL LOW (ref 80.0–100.0)
Platelets: 368 10*3/uL (ref 150–400)
RBC: 4.36 MIL/uL (ref 3.87–5.11)
RDW: 19.6 % — ABNORMAL HIGH (ref 11.5–15.5)
WBC: 12.9 10*3/uL — ABNORMAL HIGH (ref 4.0–10.5)
nRBC: 0 % (ref 0.0–0.2)

## 2019-05-10 LAB — BASIC METABOLIC PANEL
Anion gap: 11 (ref 5–15)
BUN: 31 mg/dL — ABNORMAL HIGH (ref 8–23)
CO2: 27 mmol/L (ref 22–32)
Calcium: 9.2 mg/dL (ref 8.9–10.3)
Chloride: 99 mmol/L (ref 98–111)
Creatinine, Ser: 1.15 mg/dL — ABNORMAL HIGH (ref 0.44–1.00)
GFR calc Af Amer: 47 mL/min — ABNORMAL LOW (ref >60)
GFR calc non Af Amer: 41 mL/min — ABNORMAL LOW (ref >60)
Glucose, Bld: 144 mg/dL — ABNORMAL HIGH (ref 70–99)
Potassium: 3.3 mmol/L — ABNORMAL LOW (ref 3.5–5.1)
Sodium: 137 mmol/L (ref 135–145)

## 2019-05-10 LAB — SARS CORONAVIRUS 2 BY RT PCR (HOSPITAL ORDER, PERFORMED IN ~~LOC~~ HOSPITAL LAB): SARS Coronavirus 2: NEGATIVE

## 2019-05-10 LAB — SURGICAL PCR SCREEN
MRSA, PCR: POSITIVE — AB
Staphylococcus aureus: POSITIVE — AB

## 2019-05-10 LAB — SAMPLE TO BLOOD BANK

## 2019-05-10 MED ORDER — LORATADINE 10 MG PO TABS
10.0000 mg | ORAL_TABLET | Freq: Every day | ORAL | Status: DC
  Administered 2019-05-12 – 2019-05-15 (×4): 10 mg via ORAL
  Filled 2019-05-10 (×4): qty 1

## 2019-05-10 MED ORDER — FENTANYL CITRATE (PF) 100 MCG/2ML IJ SOLN
INTRAMUSCULAR | Status: AC
  Administered 2019-05-10: 50 ug via INTRAVENOUS
  Filled 2019-05-10: qty 2

## 2019-05-10 MED ORDER — MORPHINE SULFATE (PF) 2 MG/ML IV SOLN
2.0000 mg | INTRAVENOUS | Status: DC | PRN
  Administered 2019-05-10 – 2019-05-11 (×2): 2 mg via INTRAVENOUS
  Filled 2019-05-10 (×2): qty 1

## 2019-05-10 MED ORDER — FENTANYL CITRATE (PF) 100 MCG/2ML IJ SOLN
50.0000 ug | Freq: Once | INTRAMUSCULAR | Status: AC
  Administered 2019-05-10: 50 ug via INTRAVENOUS

## 2019-05-10 MED ORDER — FENTANYL CITRATE (PF) 100 MCG/2ML IJ SOLN
50.0000 ug | Freq: Once | INTRAMUSCULAR | Status: AC
  Administered 2019-05-10: 50 ug via INTRAVENOUS
  Filled 2019-05-10: qty 2

## 2019-05-10 MED ORDER — CITALOPRAM HYDROBROMIDE 20 MG PO TABS
10.0000 mg | ORAL_TABLET | Freq: Every day | ORAL | Status: DC
  Administered 2019-05-12 – 2019-05-15 (×4): 10 mg via ORAL
  Filled 2019-05-10 (×4): qty 1

## 2019-05-10 MED ORDER — ONDANSETRON HCL 4 MG/2ML IJ SOLN
4.0000 mg | Freq: Once | INTRAMUSCULAR | Status: AC
  Administered 2019-05-10: 4 mg via INTRAVENOUS

## 2019-05-10 MED ORDER — MIRABEGRON ER 25 MG PO TB24
25.0000 mg | ORAL_TABLET | Freq: Every day | ORAL | Status: DC
  Administered 2019-05-12 – 2019-05-15 (×4): 25 mg via ORAL
  Filled 2019-05-10 (×5): qty 1

## 2019-05-10 MED ORDER — CALCIUM CARBONATE ANTACID 500 MG PO CHEW
1250.0000 mg | CHEWABLE_TABLET | Freq: Two times a day (BID) | ORAL | Status: DC
  Administered 2019-05-12 – 2019-05-14 (×4): 1250 mg via ORAL
  Filled 2019-05-10 (×4): qty 7

## 2019-05-10 MED ORDER — ONDANSETRON HCL 4 MG/2ML IJ SOLN
INTRAMUSCULAR | Status: AC
  Administered 2019-05-10: 4 mg via INTRAVENOUS
  Filled 2019-05-10: qty 2

## 2019-05-10 MED ORDER — TRIAMTERENE-HCTZ 75-50 MG PO TABS
1.0000 | ORAL_TABLET | Freq: Every day | ORAL | Status: DC
  Filled 2019-05-10: qty 1

## 2019-05-10 MED ORDER — TRANEXAMIC ACID-NACL 1000-0.7 MG/100ML-% IV SOLN
1000.0000 mg | INTRAVENOUS | Status: AC
  Administered 2019-05-11: 1000 mg via INTRAVENOUS
  Filled 2019-05-10: qty 100

## 2019-05-10 MED ORDER — SODIUM CHLORIDE 0.9 % IV SOLN
INTRAVENOUS | Status: DC | PRN
  Administered 2019-05-10: 250 mL via INTRAVENOUS

## 2019-05-10 MED ORDER — POTASSIUM CHLORIDE 10 MEQ/100ML IV SOLN
10.0000 meq | INTRAVENOUS | Status: AC
  Administered 2019-05-10 – 2019-05-11 (×3): 10 meq via INTRAVENOUS
  Filled 2019-05-10 (×3): qty 100

## 2019-05-10 MED ORDER — HALOPERIDOL LACTATE 5 MG/ML IJ SOLN: 2.0000 mg | Freq: Four times a day (QID) | INTRAMUSCULAR | Status: DC | PRN

## 2019-05-10 MED ORDER — CEFAZOLIN SODIUM-DEXTROSE 2-4 GM/100ML-% IV SOLN
2.0000 g | INTRAVENOUS | Status: AC
  Administered 2019-05-11: 2 g via INTRAVENOUS
  Filled 2019-05-10: qty 100

## 2019-05-10 NOTE — ED Notes (Signed)
Pt has on yellow socks, yellow arm band, and door open

## 2019-05-10 NOTE — ED Provider Notes (Addendum)
-----------------------------------------   1:41 PM on 05/10/2019 -----------------------------------------  I have seen and evaluated the patient.  Patient has dementia and cannot contribute to her history.  Patient appears to be experiencing pain in the right leg.  Has a shortened and somewhat internally rotated leg.  We will obtain x-ray imaging to evaluate.  Report of outpatient x-ray showing a midshaft femur fracture.  We will obtain femur as well as right hip films.  We will check basic labs, corona test and EKG as a precaution for likely anticipated admission.  EKG viewed and interpreted by myself shows a normal sinus rhythm at 79 bpm with a slightly widened QRS, left axis deviation largely normal intervals and nonspecific ST changes.  Discussed the patient with Dr. Odis Luster, patient's prior surgery of total hip and knee replacement was performed at [facility] recommends transfer to [facility].  We are currently awaiting callback to transfer the patient.     Minna Antis, MD 05/10/19 1525  I discussed with the transfer center.  [facility] states they did not perform the patient's total knee.  I discussed patient with Dr. Odis Luster locally who states he will perform the surgery.  Will admit to the hospitalist service.  We will place in a knee immobilizer.    Minna Antis, MD 05/10/19 1553

## 2019-05-10 NOTE — ED Notes (Signed)
Per Dr Nancy Marus knee immobilizer not to be placed d/t patient having surgery tomorrow and she does not see that the immobilizer will be beneficial at this time

## 2019-05-10 NOTE — ED Provider Notes (Signed)
[facility] Emergency Department Provider Note  ____________________________________________  Time seen: Approximately 1:26 PM  I have reviewed the triage vital signs and the nursing notes.   HISTORY  Chief Complaint Leg Pain  Level 5 caveat: Patient with Alzheimer's dementia.  Unable to contribute to history.  HPI Caroline Wilkerson is a 85 y.o. female who presents emergency department via EMS from long-term care facility for reported broken femur.  According to EMS, patient fell  yesterday.  Patient had orders for outpatient x-ray which was performed in the facility.  According to the facility, patient has a displaced right femur fracture.  Patient was transported to the emergency department for further evaluation.  There were no reports that patient hit her head or loss consciousness but this is unsubstantiated.  Again, patient cannot answer any questions appropriately at this time.  Patient has a history of Alzheimer's dementia, chronic kidney disease, diabetes, hypertension according to past medical history.        Past Medical History:  Diagnosis Date  . Alzheimer's dementia (HCC)   . Arthritis   . Cardiomegaly   . Chronic kidney disease   . Depression   . Diabetes mellitus without complication (HCC)   . Fibrocystic breast   . Hearing loss   . Hyperlipidemia   . Hypertension   . Vertigo     Patient Active Problem List   Diagnosis Date Noted  . SDH (subdural hematoma) (HCC) 12/17/2017  . Cellulitis 02/17/2016  . Generalized weakness 02/17/2016  . Decubitus ulcer of buttock, stage 1 10/21/2015  . Hypertension   . Hyperlipidemia   . Alzheimer's dementia [facility])     Past Surgical History:  Procedure Laterality Date  . ABDOMINAL HYSTERECTOMY    . BREAST SURGERY    . CARPAL TUNNEL RELEASE    . CATARACT EXTRACTION Bilateral   . CHOLECYSTECTOMY    . JOINT REPLACEMENT Right    knee and hip  . rotator cuff surgery Bilateral     Prior to  Admission medications   Medication Sig Start Date End Date Taking? Authorizing Provider  meloxicam (MOBIC) 15 MG tablet Take 0.5 tablets (7.5 mg total) by mouth 2 (two) times daily. 12/19/17  Yes Delfino Lovett, MD  acetaminophen (TYLENOL) 500 MG tablet Take 500 mg by mouth every 6 (six) hours as needed for moderate pain.     [provider]  calcium carbonate (OS-CAL) 600 MG TABS tablet Take 600 mg by mouth 2 (two) times daily with a meal.    [provider]  citalopram (CELEXA) 10 MG tablet Take 10 mg by mouth daily.    [provider]  fexofenadine (ALLEGRA) 180 MG tablet Take 180 mg by mouth every morning.    [provider]  Lidocaine (ASPERCREME LIDOCAINE) 4 % PTCH Apply 1 patch topically daily. To affected area of back for 12 hours, off for 12 hours    [provider]  mirabegron ER (MYRBETRIQ) 25 MG TB24 tablet Take 1 tablet (25 mg total) by mouth daily. 10/09/15   Steele Sizer, MD  mirtazapine (REMERON SOL-TAB) 15 MG disintegrating tablet Take 1 tablet (15 mg total) by mouth at bedtime as needed (use for sleep and combatave behaviour). Patient not taking: Reported on 12/17/2017 02/23/16   Steele Sizer, MD  triamterene-hydrochlorothiazide (MAXZIDE) 75-50 MG tablet Take 1 tablet by mouth daily.    [provider]    Allergies Codeine and Hydrocodone  Family History  Problem Relation Age of Onset  .  Hypertension Daughter   . Hypertension Son   . Diabetes Son     Social History Social History   Tobacco Use  . Smoking status: Never Smoker  . Smokeless tobacco: Never Used  Substance Use Topics  . Alcohol use: No  . Drug use: No     Review of Systems  Constitutional: No fever/chills Musculoskeletal: Positive for right femur fracture according to long-term care facility  10-point ROS otherwise negative.  ____________________________________________   PHYSICAL EXAM:  VITAL SIGNS: ED Triage Vitals [05/10/19 1312]   Enc Vitals Group     BP 137/72     Pulse Rate 77     Resp (!) 22     Temp 97.8 F (36.6 C)     Temp Source Axillary     SpO2 95 %     Weight 140 lb (63.5 kg)     Height 5\' 4"  (1.626 m)     Head Circumference      Peak Flow      Pain Score      Pain Loc      Pain Edu?      Excl. in GC?      Constitutional: Alert and oriented. Well appearing and in no acute distress. Eyes: Conjunctivae are normal. PERRL. EOMI. Head: Atraumatic.  No visible signs of trauma. ENT:      Ears:       Nose: No congestion/rhinnorhea.      Mouth/Throat: Mucous membranes are moist.  Neck: No stridor.  Neck is supple full range of motion.  Patient does not wince or withdrawal to palpation along the cervical spine.  Cardiovascular: Normal rate, regular rhythm. Normal S1 and S2.  Good peripheral circulation. Respiratory: Normal respiratory effort without tachypnea or retractions. Lungs CTAB. Good air entry to the bases with no decreased or absent breath sounds. Gastrointestinal: Bowel sounds 4 quadrants. Soft and nontender to palpation. No guarding or rigidity. No palpable masses. No distention.  Musculoskeletal: Limited range of motion to the right lower extremity.  Right lower extremity is rotated shortened.  Palpation does not reveal any significant palpable deformity.  No penetration to the skin.  No range of motion is attempted with the right lower extremity.  Dorsalis pedis pulse intact distally. Neurologic: Patient is demented.  Unable to answer questions appropriately.  No gross focal neurologic deficits are appreciated.  Skin:  Skin is warm, dry and intact. No rash noted. Psychiatric:  Patient is demented, unable to answer questions appropriately.   ____________________________________________   LABS (all labs ordered are listed, but only abnormal results are displayed)  Labs Reviewed  CBC - Abnormal; Notable for the following components:      Result Value   WBC 12.9 (*)    Hemoglobin 8.6  (*)    HCT 30.0 (*)    MCV 68.8 (*)    MCH 19.7 (*)    MCHC 28.7 (*)    RDW 19.6 (*)    All other components within normal limits  BASIC METABOLIC PANEL - Abnormal; Notable for the following components:   Potassium 3.3 (*)    Glucose, Bld 144 (*)    BUN 31 (*)    Creatinine, Ser 1.15 (*)    GFR calc non Af Amer 41 (*)    GFR calc Af Amer 47 (*)    All other components within normal limits  SARS CORONAVIRUS 2 (HOSPITAL ORDER, PERFORMED IN [facility])  SAMPLE TO BLOOD BANK   ____________________________________________  EKG  ____________________________________________  RADIOLOGY I personally viewed and evaluated these images as part of my medical decision making, as well as reviewing the written report by the radiologist.  Dg Chest 1 View  Result Date: 05/10/2019 CLINICAL DATA:  Hypertension.  Knee fracture after fall. EXAM: CHEST  1 VIEW COMPARISON:  Radiograph February 11, 2016. FINDINGS: Stable cardiomegaly. Atherosclerosis of thoracic aorta is noted. Stable large hiatal hernia is noted. No pneumothorax or pleural effusion is noted. Both lungs are clear. The visualized skeletal structures are unremarkable. IMPRESSION: Stable large hiatal hernia.  No acute abnormality seen. Aortic Atherosclerosis (ICD10-I70.0). Electronically Signed   By: Lupita Raider M.D.   On: 05/10/2019 13:53   Dg Femur Caroline 2 Views Right  Result Date: 05/10/2019 CLINICAL DATA:  Fall. EXAM: RIGHT FEMUR 2 VIEWS COMPARISON:  None. FINDINGS: Status post right hip arthroplasty. Vascular calcifications are noted. Severely displaced and probably comminuted fracture is seen involving the distal right femoral shaft just above femoral prosthesis of right total knee arthroplasty. IMPRESSION: Severely displaced and probably comminuted fracture involving distal right femoral shaft just above femoral prosthesis. Electronically Signed   By: Lupita Raider M.D.   On: 05/10/2019 13:54     ____________________________________________    PROCEDURES  Procedure(s) performed:    Procedures    Medications  fentaNYL (SUBLIMAZE) injection 50 mcg (50 mcg Intravenous Given 05/10/19 1327)  ondansetron (ZOFRAN) injection 4 mg (4 mg Intravenous Given 05/10/19 1327)  fentaNYL (SUBLIMAZE) injection 50 mcg (50 mcg Intravenous Given 05/10/19 1549)     ____________________________________________   INITIAL IMPRESSION / ASSESSMENT AND PLAN / ED COURSE  Pertinent labs & imaging results that were available during my care of the patient were reviewed by me and considered in my medical decision making (see chart for details).  Review of the [place] CSRS was performed in accordance of the [facility] prior to dispensing any controlled drugs.           Patient's diagnosis is consistent with acutely displaced femur fracture.  Patient presented to the emergency department from long-term care facility for complaint of femur fracture.  Patient apparently fell yesterday, had imaging done at long-term care facility this morning which revealed femur fracture.  This is confirmed on imaging here in the hospital.  Orthopedic surgery advises they are unable to repair this fracture and recommend transfer to tertiary center.  Tertiary center advised that they did not perform the knee replacement and recommended local orthopedics to perform surgical repair.  Patient will be admitted to the hospitalist service and orthopedics will perform surgery locally.     ____________________________________________  FINAL CLINICAL IMPRESSION(S) / ED DIAGNOSES  Final diagnoses:  Fall  Closed fracture of right femur, unspecified fracture morphology, unspecified portion of femur, initial encounter (HCC)      NEW MEDICATIONS STARTED DURING THIS VISIT:  ED Discharge Orders    None          This chart was dictated using voice recognition software/Dragon. Despite best efforts to proofread, errors can occur  which can change the meaning. Any change was purely unintentional.    Racheal Patches, PA-C 05/10/19 1620    Minna Antis, MD 05/12/19 2258

## 2019-05-10 NOTE — ED Notes (Signed)
Pt to xray

## 2019-05-10 NOTE — Progress Notes (Signed)
Family Meeting Note  Advance Directive:yes  Today a meeting took place with the daughter via the phone.  Patient is unable to participate due to- alzheimer's dementia  The following clinical team members were present during this meeting:MD  The following were discussed:Patient's diagnosis: right femur fracture, Patient's progosis: Unable to determine and Goals for treatment: DNR   Discussed CODE STATUS with patient's daughter over the phone.  Patient's daughter states that due to patient's age, the family would like for her to be DNR.  Additional follow-up to be provided: prn  Time spent during discussion:20 minutes  Hilton Sinclair, MD

## 2019-05-10 NOTE — Consult Note (Addendum)
Patient chart, x-rays and labs reviewed. Plan operative fixation tomorrow afternoon for pain control. Will need 2 units of PRBC's for tomorrow. Full consult note to follow.

## 2019-05-10 NOTE — ED Triage Notes (Signed)
Pt presents to ED via [facility] from [facility]. Per EMS pt c/o R leg pain x 24 hrs. EMS reports today X-ray was done and a femur fracture above the R knee was found. EMS reports VSS, hx of dementia, increased pain with movement. Pt can be heard crying out when moved with EMS.

## 2019-05-10 NOTE — ED Notes (Signed)
ED TO INPATIENT HANDOFF REPORT  ED Nurse Name and Phone #: Ariel [phone]  S Name/Age/Gender Caroline Wilkerson 85 y.o. female Room/Bed: ED08A/ED08A  Code Status   Code Status: Prior  Home/SNF/Other Nursing Home Patient oriented to: self Is this baseline? Yes   Triage Complete: Triage complete  Chief Complaint ems/leg injury  Triage Note Pt presents to ED via [facility] from [facility]. Per EMS pt c/o R leg pain x 24 hrs. EMS reports today X-ray was done and a femur fracture above the R knee was found. EMS reports VSS, hx of dementia, increased pain with movement. Pt can be heard crying out when moved with EMS.    Allergies Allergies  Allergen Reactions  . Codeine Diarrhea, Nausea And Vomiting and Other (See Comments)    Nausea   . Hydrocodone Nausea Only    Level of Care/Admitting Diagnosis ED Disposition    ED Disposition Condition Comment   Admit  Hospital Area: [facility] [[id number]]  Level of Care: Med-Surg [16]  Covid Evaluation: Confirmed COVID Negative  Diagnosis: Displaced fracture of distal epiphysis of right femur [facility](HCC) [[id number]]) [[id number]]  Admitting Physician: Willadean CarolMAYO, KATY DODD [[id number]]  Attending Physician: Willadean CarolMAYO, KATY DODD [[id number]]  Estimated length of stay: past midnight tomorrow  Certification:: I certify this patient will need inpatient services for at least 2 midnights  PT Class (Do Not Modify): Inpatient [101]  PT Acc Code (Do Not Modify): Private [1]       B Medical/Surgery History Past Medical History:  Diagnosis Date  . Alzheimer's dementia (HCC)   . Arthritis   . Cardiomegaly   . Chronic kidney disease   . Depression   . Diabetes mellitus without complication (HCC)   . Fibrocystic breast   . Hearing loss   . Hyperlipidemia   . Hypertension   . Vertigo    Past Surgical History:  Procedure Laterality Date  . ABDOMINAL HYSTERECTOMY    . BREAST SURGERY    . CARPAL TUNNEL RELEASE    . CATARACT EXTRACTION Bilateral   .  CHOLECYSTECTOMY    . JOINT REPLACEMENT Right    knee and hip  . rotator cuff surgery Bilateral      A IV Location/Drains/Wounds Patient Lines/Drains/Airways Status   Active Line/Drains/Airways    Name:   Placement date:   Placement time:   Site:   Days:   Peripheral IV 12/16/17 Left Wrist   12/16/17    2118    Wrist   510   Wound / Incision (Open or Dehisced) 02/17/16 Other (Comment) Axilla Right abscess   02/17/16    1700    Axilla   1178   Wound / Incision (Open or Dehisced) 12/16/17 Laceration Head Posterior   12/16/17    -    Head   510          Intake/Output Last 24 hours No intake or output data in the 24 hours ending 05/10/19 1652  Labs/Imaging Results for orders placed or performed during the hospital encounter of 05/10/19 (from the past 48 hour(s))  CBC     Status: Abnormal   Collection Time: 05/10/19  1:13 PM  Result Value Ref Range   WBC 12.9 (H) 4.0 - 10.5 K/uL   RBC 4.36 3.87 - 5.11 MIL/uL   Hemoglobin 8.6 (L) 12.0 - 15.0 g/dL    Comment: Reticulocyte Hemoglobin testing may be clinically indicated, consider ordering this additional test [id number]    HCT 30.0 (L) 36.0 -  46.0 %   MCV 68.8 (L) 80.0 - 100.0 fL   MCH 19.7 (L) 26.0 - 34.0 pg   MCHC 28.7 (L) 30.0 - 36.0 g/dL   RDW 16.1 (H) 09.6 - 04.5 %   Platelets 368 150 - 400 K/uL   nRBC 0.0 0.0 - 0.2 %    Comment: Performed at [facility], [address] [place], [place] [address]  Basic metabolic panel     Status: Abnormal   Collection Time: 05/10/19  1:13 PM  Result Value Ref Range   Sodium 137 135 - 145 mmol/L   Potassium 3.3 (L) 3.5 - 5.1 mmol/L   Chloride 99 98 - 111 mmol/L   CO2 27 22 - 32 mmol/L   Glucose, Bld 144 (H) 70 - 99 mg/dL   BUN 31 (H) 8 - 23 mg/dL   Creatinine, Ser 1.91 (H) 0.44 - 1.00 mg/dL   Calcium 9.2 8.9 - 47.8 mg/dL   GFR calc non Af Amer 41 (L) >60 mL/min   GFR calc Af Amer 47 (L) >60 mL/min   Anion gap 11 5 - 15    Comment: Performed at [facility], [address], [place], [place] [address]  Sample to Blood Bank     Status: None   Collection Time: 05/10/19  1:13 PM  Result Value Ref Range   Blood Bank Specimen SAMPLE AVAILABLE FOR TESTING    Sample Expiration      05/13/2019,2359 Performed at [facility], [address], [place], [place] [address]   SARS Coronavirus 2 (CEPHEID - Performed in [facility] hospital lab), Hosp Order     Status: None   Collection Time: 05/10/19  2:14 PM  Result Value Ref Range   SARS Coronavirus 2 NEGATIVE NEGATIVE    Comment: Performed at [facility], [address], [place], [place] [address]   Dg Chest 1 View  Result Date: 05/10/2019 CLINICAL DATA:  Hypertension.  Knee fracture after fall. EXAM: CHEST  1 VIEW COMPARISON:  Radiograph February 11, 2016. FINDINGS: Stable cardiomegaly. Atherosclerosis of thoracic aorta is noted. Stable large hiatal hernia is noted. No pneumothorax or pleural effusion is noted. Both lungs are clear. The visualized skeletal structures are unremarkable. IMPRESSION: Stable large hiatal hernia.  No acute abnormality seen. Aortic Atherosclerosis (ICD10-I70.0). Electronically Signed   By: Lupita Raider M.D.   On: 05/10/2019 13:53   Dg Femur Min 2 Views Right  Result Date: 05/10/2019 CLINICAL DATA:  Fall. EXAM: RIGHT FEMUR 2 VIEWS COMPARISON:  None. FINDINGS: Status post right hip arthroplasty. Vascular calcifications are noted. Severely displaced and probably comminuted fracture is seen involving the distal right femoral shaft just above femoral prosthesis of right total knee arthroplasty. IMPRESSION: Severely displaced and probably comminuted fracture involving distal right femoral shaft just above femoral prosthesis. Electronically Signed   By: Lupita Raider M.D.   On: 05/10/2019 13:54    Pending Labs [employer] (From admission, onward)    Start     Ordered   Signed and Armed forces training and education officer morning,   R     Signed and Held   Signed and  Held  CBC  Tomorrow morning,   R     Signed and Held   Signed and Held  Protime-INR  Tomorrow morning,   R     Signed and Held   Signed and Held  Ferritin  Tomorrow morning,   R     Signed  and Held   Signed and Held  Iron and TIBC  Tomorrow morning,   R     Signed and Held   Signed and Held  Folate  Tomorrow morning,   R     Signed and Held   Signed and Held  Vitamin B12  Tomorrow morning,   R     Signed and Held   Signed and Held  Occult blood card to lab, stool RN will collect  Once,   R    Question:  Specimen to be collected by?  Answer:  RN will collect   Signed and Held          Vitals/Pain Today's Vitals   05/10/19 1531 05/10/19 1600 05/10/19 1601 05/10/19 1607  BP: (!) 131/93 140/72    Pulse: 83 79 73 80  Resp: 13 20 16 18   Temp:      TempSrc:      SpO2: 97%  94% 95%  Weight:      Height:        Isolation Precautions No active isolations  Medications Medications  fentaNYL (SUBLIMAZE) injection 50 mcg (50 mcg Intravenous Given 05/10/19 1327)  ondansetron (ZOFRAN) injection 4 mg (4 mg Intravenous Given 05/10/19 1327)  fentaNYL (SUBLIMAZE) injection 50 mcg (50 mcg Intravenous Given 05/10/19 1549)    Mobility walks with device High fall risk   Focused Assessments Cardiac Assessment Handoff:    Lab Results  Component Value Date   TROPONINI <0.03 02/11/2016   No results found for: DDIMER Does the Patient currently have chest pain? No     R Recommendations: See Admitting Provider Note  Report given to:   Additional Notes: Pt fell yesterday and the facility xray'd today showing fracture - knee immobilizer was NOT placed per Dr Nancy Marus - pt is calm until the pain medication starts to wear off and then she screams and cries in pain until more medication can be administered

## 2019-05-10 NOTE — H&P (Addendum)
[facility] [place] [facility]   PATIENT NAME: Caroline Wilkerson    MR#:  [id number]  DATE OF BIRTH:  1922-12-26  DATE OF ADMISSION:  05/10/2019  PRIMARY CARE PHYSICIAN: [facility]   REQUESTING/REFERRING PHYSICIAN: Minna Antis, MD  CHIEF COMPLAINT:   Chief Complaint  Patient presents with  . Leg Pain    HISTORY OF PRESENT ILLNESS:  Caroline Wilkerson  is a 85 y.o. female with a known history of hypertension, T2DM, depression, alzheimer's dementia, hyperlipidemia who was brought to the ED from the nursing home with right femur fracture. She had an x-ray performed as an outpatient that showed a midshaft femur fracture. She has a history of total right hip and total right knee replacement.  Patient has advanced dementia and is unable to provide any history.  History obtained from the ED physician and from the chart.  In the ED, vitals were unremarkable.  Labs were significant for K3.3, creatinine 1.15, hemoglobin 8.6.  Right femur x-ray showed severely displaced and probably comminuted fracture of the distal right femoral shaft just above the femoral prosthesis.  Hospitalists were called for admission.  PAST MEDICAL HISTORY:   Past Medical History:  Diagnosis Date  . Alzheimer's dementia (HCC)   . Arthritis   . Cardiomegaly   . Chronic kidney disease   . Depression   . Diabetes mellitus without complication (HCC)   . Fibrocystic breast   . Hearing loss   . Hyperlipidemia   . Hypertension   . Vertigo     PAST SURGICAL HISTORY:   Past Surgical History:  Procedure Laterality Date  . ABDOMINAL HYSTERECTOMY    . BREAST SURGERY    . CARPAL TUNNEL RELEASE    . CATARACT EXTRACTION Bilateral   . CHOLECYSTECTOMY    . JOINT REPLACEMENT Right    knee and hip  . rotator cuff surgery Bilateral     SOCIAL HISTORY:   Social History   Tobacco Use  . Smoking status: Never Smoker  . Smokeless tobacco: Never Used  Substance Use Topics  .  Alcohol use: No    FAMILY HISTORY:   Family History  Problem Relation Age of Onset  . Hypertension Daughter   . Hypertension Son   . Diabetes Son     DRUG ALLERGIES:   Allergies  Allergen Reactions  . Codeine Diarrhea and Nausea And Vomiting    REVIEW OF SYSTEMS:   ROS-unable to obtain due to advanced dementia  MEDICATIONS AT HOME:   Prior to Admission medications   Medication Sig Start Date End Date Taking? Authorizing Provider  acetaminophen (TYLENOL) 500 MG tablet Take 500 mg by mouth every 6 (six) hours as needed for moderate pain.     [provider]  calcium carbonate (OS-CAL) 600 MG TABS tablet Take 600 mg by mouth 2 (two) times daily with a meal.    [provider]  citalopram (CELEXA) 10 MG tablet Take 10 mg by mouth daily.    [provider]  fexofenadine (ALLEGRA) 180 MG tablet Take 180 mg by mouth every morning.    [provider]  Lidocaine (ASPERCREME LIDOCAINE) 4 % PTCH Apply 1 patch topically daily. To affected area of back for 12 hours, off for 12 hours    [provider]  meloxicam (MOBIC) 15 MG tablet Take 0.5 tablets (7.5 mg total) by mouth 2 (two) times daily. 12/19/17   Delfino Lovett, MD  mirabegron ER (MYRBETRIQ) 25 MG TB24 tablet Take  1 tablet (25 mg total) by mouth daily. 10/09/15   Steele Sizerrissman, Mark A, MD  mirtazapine (REMERON SOL-TAB) 15 MG disintegrating tablet Take 1 tablet (15 mg total) by mouth at bedtime as needed (use for sleep and combatave behaviour). Patient not taking: Reported on 12/17/2017 02/23/16   Steele Sizerrissman, Mark A, MD  triamterene-hydrochlorothiazide (MAXZIDE) 75-50 MG tablet Take 1 tablet by mouth daily.    [provider]      VITAL SIGNS:  Blood pressure (!) 131/93, pulse 83, temperature 97.8 F (36.6 C), temperature source Axillary, resp. rate 13, height 5\' 4"  (1.626 m), weight 63.5 kg, SpO2 97 %.  PHYSICAL EXAMINATION:  Physical Exam  GENERAL:  85 y.o.-year-old patient lying in  the bed with no acute distress.  EYES: Pupils equal, round, reactive to light and accommodation. No scleral icterus. Extraocular muscles intact.  HEENT: Head atraumatic, normocephalic. Oropharynx and nasopharynx clear.  NECK:  Supple, no jugular venous distention. No thyroid enlargement, no tenderness.  LUNGS: Normal breath sounds bilaterally, no wheezing, rales,rhonchi or crepitation. No use of accessory muscles of respiration.  CARDIOVASCULAR: RRR, S1, S2 normal. No murmurs, rubs, or gallops.  ABDOMEN: Soft, nontender, nondistended. Bowel sounds present. No organomegaly or mass.  EXTREMITIES: No pedal edema, cyanosis, or clubbing. + Right lower extremity internally rotated NEUROLOGIC: Difficult to assess secondary to dementia.  Unable to assess pain in the right lower extremity secondary to pain and inability to follow commands. Gait not checked.  PSYCHIATRIC: The patient is alert.  Does not answer questions of orientation. SKIN: No obvious rash, lesion, or ulcer.   LABORATORY PANEL:   CBC Recent Labs  Lab 05/10/19 1313  WBC 12.9*  HGB 8.6*  HCT 30.0*  PLT 368   ------------------------------------------------------------------------------------------------------------------  Chemistries  Recent Labs  Lab 05/10/19 1313  NA 137  K 3.3*  CL 99  CO2 27  GLUCOSE 144*  BUN 31*  CREATININE 1.15*  CALCIUM 9.2   ------------------------------------------------------------------------------------------------------------------  Cardiac Enzymes No results for input(s): TROPONINI in the last 168 hours. ------------------------------------------------------------------------------------------------------------------  RADIOLOGY:  Dg Chest 1 View  Result Date: 05/10/2019 CLINICAL DATA:  Hypertension.  Knee fracture after fall. EXAM: CHEST  1 VIEW COMPARISON:  Radiograph February 11, 2016. FINDINGS: Stable cardiomegaly. Atherosclerosis of thoracic aorta is noted. Stable large hiatal  hernia is noted. No pneumothorax or pleural effusion is noted. Both lungs are clear. The visualized skeletal structures are unremarkable. IMPRESSION: Stable large hiatal hernia.  No acute abnormality seen. Aortic Atherosclerosis (ICD10-I70.0). Electronically Signed   By: Lupita RaiderJames  Green Jr M.D.   On: 05/10/2019 13:53   Dg Femur Min 2 Views Right  Result Date: 05/10/2019 CLINICAL DATA:  Fall. EXAM: RIGHT FEMUR 2 VIEWS COMPARISON:  None. FINDINGS: Status post right hip arthroplasty. Vascular calcifications are noted. Severely displaced and probably comminuted fracture is seen involving the distal right femoral shaft just above femoral prosthesis of right total knee arthroplasty. IMPRESSION: Severely displaced and probably comminuted fracture involving distal right femoral shaft just above femoral prosthesis. Electronically Signed   By: Lupita RaiderJames  Green Jr M.D.   On: 05/10/2019 13:54    IMPRESSION AND PLAN:   Right femoral fracture- x-ray with severely displaced and probably comminuted right distal femoral fracture. Has a hx of THA and TKA on the right. -Orthopedic surgery consult- likely surgery tomorrow -Pain control -IV fluids -Will make patient n.p.o. at midnight -PT consult after surgery  Hypokalemia -Replete and recheck  Microcytic anemia- may be due to iron deficiency.  No active bleeding. -Check anemia  panel and FOBT -Will need to monitor hemoglobin closely after surgery  Hypertension- BP is normal in the ED -Continue home maxzide  Depression-stable -Continue home Celexa  Severe Alzheimer's dementia -Supportive care  All the records are reviewed and case discussed with ED provider. Management plans discussed with the patient, family and they are in agreement.  CODE STATUS: DNR-discussed with daughter over the phone  TOTAL TIME TAKING CARE OF THIS PATIENT: 45 minutes.    Jinny Blossom Chae Shuster M.D on 05/10/2019 at 4:02 PM  Between 7am to 6pm - Pager [phone]  After 6pm go to  [email or web address] - Social research officer, government  [facility] [place] [facility]  Office  [phone]  CC: Primary care physician; [facility]   Note: This dictation was prepared with Dragon dictation along with smaller phrase technology. Any transcriptional errors that result from this process are unintentional.

## 2019-05-11 ENCOUNTER — Inpatient Hospital Stay: Payer: Medicare Other | Admitting: Certified Registered"

## 2019-05-11 ENCOUNTER — Inpatient Hospital Stay: Payer: Medicare Other

## 2019-05-11 ENCOUNTER — Encounter
Admission: EM | Disposition: A | Discharge status: Skilled Nursing Facility | Payer: Self-pay | Source: Skilled Nursing Facility | Attending: Internal Medicine

## 2019-05-11 HISTORY — PX: ORIF FEMUR FRACTURE: SHX2119

## 2019-05-11 LAB — CBC
HCT: 28 % — ABNORMAL LOW (ref 36.0–46.0)
Hemoglobin: 7.9 g/dL — ABNORMAL LOW (ref 12.0–15.0)
MCH: 19.8 pg — ABNORMAL LOW (ref 26.0–34.0)
MCHC: 28.2 g/dL — ABNORMAL LOW (ref 30.0–36.0)
MCV: 70 fL — ABNORMAL LOW (ref 80.0–100.0)
Platelets: 305 10*3/uL (ref 150–400)
RBC: 4 MIL/uL (ref 3.87–5.11)
RDW: 19.8 % — ABNORMAL HIGH (ref 11.5–15.5)
WBC: 12.6 10*3/uL — ABNORMAL HIGH (ref 4.0–10.5)
nRBC: 0 % (ref 0.0–0.2)

## 2019-05-11 LAB — BASIC METABOLIC PANEL
Anion gap: 13 (ref 5–15)
BUN: 31 mg/dL — ABNORMAL HIGH (ref 8–23)
CO2: 26 mmol/L (ref 22–32)
Calcium: 8.9 mg/dL (ref 8.9–10.3)
Chloride: 100 mmol/L (ref 98–111)
Creatinine, Ser: 1 mg/dL (ref 0.44–1.00)
GFR calc Af Amer: 56 mL/min — ABNORMAL LOW (ref >60)
GFR calc non Af Amer: 49 mL/min — ABNORMAL LOW (ref >60)
Glucose, Bld: 115 mg/dL — ABNORMAL HIGH (ref 70–99)
Potassium: 3.6 mmol/L (ref 3.5–5.1)
Sodium: 139 mmol/L (ref 135–145)

## 2019-05-11 LAB — IRON AND TIBC
Iron: 12 ug/dL — ABNORMAL LOW (ref 28–170)
Saturation Ratios: 3 % — ABNORMAL LOW (ref 10.4–31.8)
TIBC: 401 ug/dL (ref 250–450)
UIBC: 389 ug/dL

## 2019-05-11 LAB — PROTIME-INR
INR: 1 (ref 0.8–1.2)
Prothrombin Time: 13.3 seconds (ref 11.4–15.2)

## 2019-05-11 LAB — GLUCOSE, CAPILLARY: Glucose-Capillary: 76 mg/dL (ref 70–99)

## 2019-05-11 LAB — FERRITIN: Ferritin: 13 ng/mL (ref 11–307)

## 2019-05-11 LAB — VITAMIN B12: Vitamin B-12: 437 pg/mL (ref 180–914)

## 2019-05-11 LAB — PREPARE RBC (CROSSMATCH)

## 2019-05-11 LAB — ABO/RH: ABO/RH(D): O POS

## 2019-05-11 LAB — FOLATE: Folate: 17.6 ng/mL (ref >5.9)

## 2019-05-11 SURGERY — OPEN REDUCTION INTERNAL FIXATION (ORIF) DISTAL FEMUR FRACTURE
Anesthesia: Spinal | Site: Leg Upper | Laterality: Right

## 2019-05-11 MED ORDER — SODIUM CHLORIDE 0.9 % IV SOLN
INTRAVENOUS | Status: DC | PRN
  Administered 2019-05-11 (×2): via INTRAVENOUS

## 2019-05-11 MED ORDER — BUPIVACAINE-EPINEPHRINE (PF) 0.25% -1:200000 IJ SOLN
INTRAMUSCULAR | Status: AC
  Filled 2019-05-11: qty 30

## 2019-05-11 MED ORDER — ACETAMINOPHEN 500 MG PO TABS
500.0000 mg | ORAL_TABLET | Freq: Four times a day (QID) | ORAL | Status: AC
  Administered 2019-05-11 – 2019-05-12 (×4): 500 mg via ORAL
  Filled 2019-05-11 (×3): qty 1

## 2019-05-11 MED ORDER — TRAMADOL HCL 50 MG PO TABS
50.0000 mg | ORAL_TABLET | Freq: Four times a day (QID) | ORAL | Status: DC
  Administered 2019-05-12 – 2019-05-15 (×11): 50 mg via ORAL
  Filled 2019-05-11 (×12): qty 1

## 2019-05-11 MED ORDER — BACITRACIN 50000 UNITS IM SOLR
INTRAMUSCULAR | Status: AC
  Filled 2019-05-11: qty 1

## 2019-05-11 MED ORDER — SODIUM CHLORIDE 0.9% IV SOLUTION: Freq: Once | INTRAVENOUS | Status: DC

## 2019-05-11 MED ORDER — ONDANSETRON HCL 4 MG/2ML IJ SOLN: 4.0000 mg | Freq: Four times a day (QID) | INTRAMUSCULAR | Status: DC | PRN

## 2019-05-11 MED ORDER — PROPOFOL 500 MG/50ML IV EMUL
INTRAVENOUS | Status: DC | PRN
  Administered 2019-05-11: 25 ug/kg/min via INTRAVENOUS

## 2019-05-11 MED ORDER — ONDANSETRON HCL 4 MG PO TABS: 4.0000 mg | ORAL_TABLET | Freq: Four times a day (QID) | ORAL | Status: DC | PRN

## 2019-05-11 MED ORDER — METOCLOPRAMIDE HCL 5 MG/ML IJ SOLN: 5.0000 mg | Freq: Three times a day (TID) | INTRAMUSCULAR | Status: DC | PRN

## 2019-05-11 MED ORDER — KETAMINE HCL 50 MG/ML IJ SOLN
INTRAMUSCULAR | Status: AC
  Filled 2019-05-11: qty 10

## 2019-05-11 MED ORDER — SODIUM CHLORIDE 0.9 % IR SOLN
Status: DC | PRN
  Administered 2019-05-11: 1000 mL

## 2019-05-11 MED ORDER — SODIUM CHLORIDE 0.9 % IV SOLN
INTRAVENOUS | Status: DC | PRN
  Administered 2019-05-11: 10 ug/min via INTRAVENOUS

## 2019-05-11 MED ORDER — DOCUSATE SODIUM 100 MG PO CAPS
100.0000 mg | ORAL_CAPSULE | Freq: Two times a day (BID) | ORAL | Status: DC
  Administered 2019-05-11 – 2019-05-14 (×5): 100 mg via ORAL
  Filled 2019-05-11 (×8): qty 1

## 2019-05-11 MED ORDER — MUPIROCIN 2 % EX OINT
TOPICAL_OINTMENT | Freq: Two times a day (BID) | CUTANEOUS | Status: DC
  Administered 2019-05-11 – 2019-05-12 (×2): via NASAL
  Administered 2019-05-12 – 2019-05-13 (×2): 1 via NASAL
  Administered 2019-05-13 – 2019-05-15 (×4): via NASAL
  Filled 2019-05-11: qty 22

## 2019-05-11 MED ORDER — BUPIVACAINE HCL (PF) 0.5 % IJ SOLN
INTRAMUSCULAR | Status: DC | PRN
  Administered 2019-05-11: 3 mL

## 2019-05-11 MED ORDER — TRIAMTERENE-HCTZ 37.5-25 MG PO TABS
2.0000 | ORAL_TABLET | Freq: Every day | ORAL | Status: DC
  Administered 2019-05-12 – 2019-05-15 (×4): 2 via ORAL
  Filled 2019-05-11 (×5): qty 2

## 2019-05-11 MED ORDER — SODIUM CHLORIDE (PF) 0.9 % IJ SOLN
INTRAMUSCULAR | Status: AC
  Filled 2019-05-11: qty 10

## 2019-05-11 MED ORDER — CEFAZOLIN SODIUM-DEXTROSE 1-4 GM/50ML-% IV SOLN
1.0000 g | Freq: Four times a day (QID) | INTRAVENOUS | Status: AC
  Administered 2019-05-11 – 2019-05-12 (×3): 1 g via INTRAVENOUS
  Filled 2019-05-11 (×3): qty 50

## 2019-05-11 MED ORDER — ACETAMINOPHEN 325 MG PO TABS
325.0000 mg | ORAL_TABLET | Freq: Four times a day (QID) | ORAL | Status: DC | PRN
  Administered 2019-05-14 (×2): 325 mg via ORAL
  Filled 2019-05-11 (×2): qty 1

## 2019-05-11 MED ORDER — LACTATED RINGERS IV SOLN
INTRAVENOUS | Status: DC
  Administered 2019-05-11: 22:00:00 via INTRAVENOUS

## 2019-05-11 MED ORDER — PROPOFOL 10 MG/ML IV BOLUS
INTRAVENOUS | Status: DC | PRN
  Administered 2019-05-11: 20 mg via INTRAVENOUS
  Administered 2019-05-11 (×2): 10 mg via INTRAVENOUS

## 2019-05-11 MED ORDER — FENTANYL CITRATE (PF) 100 MCG/2ML IJ SOLN: 25.0000 ug | INTRAMUSCULAR | Status: DC | PRN

## 2019-05-11 MED ORDER — KETAMINE HCL 10 MG/ML IJ SOLN
INTRAMUSCULAR | Status: DC | PRN
  Administered 2019-05-11 (×2): 25 mg via INTRAVENOUS

## 2019-05-11 MED ORDER — PROPOFOL 500 MG/50ML IV EMUL
INTRAVENOUS | Status: AC
  Filled 2019-05-11: qty 50

## 2019-05-11 MED ORDER — METOCLOPRAMIDE HCL 10 MG PO TABS: 5.0000 mg | ORAL_TABLET | Freq: Three times a day (TID) | ORAL | Status: DC | PRN

## 2019-05-11 MED ORDER — BUPIVACAINE-EPINEPHRINE (PF) 0.25% -1:200000 IJ SOLN
INTRAMUSCULAR | Status: DC | PRN
  Administered 2019-05-11: 20 mL

## 2019-05-11 SURGICAL SUPPLY — 39 items
BIT DRILL CALIBRATED 3.2MM (DRILL) ×1 IMPLANT
BIT DRILL CANN QC 4.3X180 (BIT) ×1 IMPLANT
BIT DRILL Q/COUPLING 1 (BIT) ×2 IMPLANT
CANISTER SUCT 1200ML W/VALVE (MISCELLANEOUS) ×4 IMPLANT
CHLORAPREP W/TINT 26 (MISCELLANEOUS) ×4 IMPLANT
COVER WAND RF STERILE (DRAPES) ×2 IMPLANT
DRAPE C-ARM XRAY 36X54 (DRAPES) ×2 IMPLANT
DRILL BIT 4.3MM (BIT) ×2
DRILL CALIBRATED 3.2MM (DRILL) ×2
ELECT REM PT RETURN 9FT ADLT (ELECTROSURGICAL) ×2
ELECTRODE REM PT RTRN 9FT ADLT (ELECTROSURGICAL) ×1 IMPLANT
GAUZE SPONGE 4X4 12PLY STRL (GAUZE/BANDAGES/DRESSINGS) ×2 IMPLANT
GAUZE XEROFORM 1X8 LF (GAUZE/BANDAGES/DRESSINGS) ×2 IMPLANT
GLOVE INDICATOR 8.0 STRL GRN (GLOVE) ×2 IMPLANT
GLOVE SURG ORTHO 8.0 STRL STRW (GLOVE) ×4 IMPLANT
GOWN STRL REUS W/ TWL LRG LVL3 (GOWN DISPOSABLE) ×2 IMPLANT
GOWN STRL REUS W/TWL LRG LVL3 (GOWN DISPOSABLE) ×2
IMMBOLIZER KNEE 19 BLUE UNIV (SOFTGOODS) ×2 IMPLANT
KIT TURNOVER KIT A (KITS) ×2 IMPLANT
MAT ABSORB  FLUID 56X50 GRAY (MISCELLANEOUS) ×1
MAT ABSORB FLUID 56X50 GRAY (MISCELLANEOUS) ×1 IMPLANT
NS IRRIG 1000ML POUR BTL (IV SOLUTION) ×2 IMPLANT
PACK TOTAL KNEE (MISCELLANEOUS) ×2 IMPLANT
PLATE CONDYLAR CVD 8H RT (Plate) ×2 IMPLANT
SCREW CORTEX ST 4.5X34 (Screw) ×2 IMPLANT
SCREW CORTEX ST 4.5X40 (Screw) ×2 IMPLANT
SCREW CORTEX ST 4.5X62 (Screw) ×2 IMPLANT
SCREW CORTEX ST 4.5X70 (Screw) ×2 IMPLANT
SCREW LOCKING VA 5.0X60MM (Screw) ×2 IMPLANT
SCREW LOCKING VA 5.0X70MM (Screw) ×2 IMPLANT
SCREW LOCKING VA 5.0X75MM (Screw) ×2 IMPLANT
SCREW VA LOCKING 5.0X36MM (Screw) ×4 IMPLANT
SCREW VA LOCKING 5.0X50MM (Screw) ×2 IMPLANT
SCREW VA LOCKING 5.0X65 (Screw) ×2 IMPLANT
SUT DVC 2 QUILL PDO  T11 36X36 (SUTURE) ×1
SUT DVC 2 QUILL PDO T11 36X36 (SUTURE) ×1 IMPLANT
SUT VIC AB 1 CT1 18XCR BRD 8 (SUTURE) ×1 IMPLANT
SUT VIC AB 1 CT1 8-18 (SUTURE) ×1
SUT VIC AB 2-0 CT1 18 (SUTURE) ×2 IMPLANT

## 2019-05-11 NOTE — Progress Notes (Signed)
[facility] [place] at [facility]   PATIENT NAME: Gidgett Torman    MR#:  [id number]  DATE OF BIRTH:  Jun 25, 1923  SUBJECTIVE:  Pt came in with right distal fracure from facility [facility]. Has Dementia at baseline REVIEW OF SYSTEMS:   Review of Systems  Unable to perform ROS: Dementia   Tolerating Diet:npo Tolerating PT: pending  DRUG ALLERGIES:   Allergies  Allergen Reactions  . Codeine Diarrhea, Nausea And Vomiting and Other (See Comments)    Nausea   . Hydrocodone Nausea Only    VITALS:  Blood pressure 122/66, pulse 77, temperature 97.9 F (36.6 C), temperature source Oral, resp. rate 18, height 5\' 4"  (1.626 m), weight 63.5 kg, SpO2 93 %.  PHYSICAL EXAMINATION:   Physical ExamLimited  GENERAL:  85 y.o.-year-old patient lying in the bed with no acute distress.  EYES: Pupils equal, round, reactive to light and accommodation. No scleral icterus.  HEENT: Head atraumatic, normocephalic. Oropharynx and nasopharynx clear.  NECK:  Supple, no jugular venous distention. No thyroid enlargement, no tenderness.  LUNGS: Normal breath sounds bilaterally, no wheezing, rales, rhonchi. No use of accessory muscles of respiration.  CARDIOVASCULAR: S1, S2 normal. No murmurs, rubs, or gallops.  ABDOMEN: Soft, nontender, nondistended. Bowel sounds present. No organomegaly or mass.  EXTREMITIES: No cyanosis, clubbing or edema b/l. Right LE deformity noted above knee--decreased ROM    NEUROLOGIC:unable to assess but grossly non focal PSYCHIATRIC:  patient is slepepy SKIN: No obvious rash, lesion, or ulcer. --per RN  LABORATORY PANEL:  CBC Recent Labs  Lab 05/11/19 0548  WBC 12.6*  HGB 7.9*  HCT 28.0*  PLT 305    Chemistries  Recent Labs  Lab 05/11/19 0548  NA 139  K 3.6  CL 100  CO2 26  GLUCOSE 115*  BUN 31*  CREATININE 1.00  CALCIUM 8.9   Cardiac Enzymes No results for input(s): TROPONINI in the last 168 hours. RADIOLOGY:  Dg Chest 1  View  Result Date: 05/10/2019 CLINICAL DATA:  Hypertension.  Knee fracture after fall. EXAM: CHEST  1 VIEW COMPARISON:  Radiograph February 11, 2016. FINDINGS: Stable cardiomegaly. Atherosclerosis of thoracic aorta is noted. Stable large hiatal hernia is noted. No pneumothorax or pleural effusion is noted. Both lungs are clear. The visualized skeletal structures are unremarkable. IMPRESSION: Stable large hiatal hernia.  No acute abnormality seen. Aortic Atherosclerosis (ICD10-I70.0). Electronically Signed   By: Lupita Raider M.D.   On: 05/10/2019 13:53   Dg Femur Min 2 Views Right  Result Date: 05/10/2019 CLINICAL DATA:  Fall. EXAM: RIGHT FEMUR 2 VIEWS COMPARISON:  None. FINDINGS: Status post right hip arthroplasty. Vascular calcifications are noted. Severely displaced and probably comminuted fracture is seen involving the distal right femoral shaft just above femoral prosthesis of right total knee arthroplasty. IMPRESSION: Severely displaced and probably comminuted fracture involving distal right femoral shaft just above femoral prosthesis. Electronically Signed   By: Lupita Raider M.D.   On: 05/10/2019 13:54   ASSESSMENT AND PLAN:   Ritchie Jansky  is a 85 y.o. female with a known history of hypertension, T2DM, depression, alzheimer's dementia, hyperlipidemia who was brought to the ED from the nursing home with right femur fracture. She had an x-ray performed as an outpatient that showed a midshaft femur fracture  *Right femoral fracture- x-ray with severely displaced and probably comminuted right distal femoral fracture. Has a hx of THA and TKA on the right. -Orthopedic surgery consult with Dr Lilla Shook BT prior to  surgery -spoke with son Johny ShearsGarry Broadnax--OK with transfusion -Pain control -IV fluids -pt is NPO -PT consult after surgery  *Hypokalemia -Replete and recheck  *Microcytic anemia- may be due to iron deficiency.  No active bleeding. -Check anemia panel and FOBT -Will need  to monitor hemoglobin closely after surgery and consider po iron and maybe IV rion  *Hypertension- BP is normal in the ED -Continue home maxzide  *Depression-stable -Continue home Celexa  *Severe Alzheimer's dementia -Supportive care  D/w son [name] on the phone   CODE STATUS: FULL*  DVT Prophylaxis: SCD for now  TOTAL TIME TAKING CARE OF THIS PATIENT: *30* minutes.  >50% time spent on counselling and coordination of care  POSSIBLE D/C IN *few* DAYS, DEPENDING ON CLINICAL CONDITION.  Note: This dictation was prepared with Dragon dictation along with smaller phrase technology. Any transcriptional errors that result from this process are unintentional.  Enedina FinnerSona Lorrayne Ismael M.D on 05/11/2019 at 8:00 AM  Between 7am to 6pm - Pager - [phone]  After 6pm go to [email or web address] - Social research officer, governmentpassword EPAS [email or web address]  [facility] [place] [facility]  Office  [phone]  CC: Primary care physician; [facility] MakingPatient ID: Laurier Nancyoris K Das, female   DOB: January 24, 1923, 85 y.o.   MRN: [id number]

## 2019-05-11 NOTE — Progress Notes (Signed)
Discussed the plan of care in detail with her son and he wishes to proceed with operative fixation.  The diagnosis, risks, benefits and alternatives to treatment are all discussed in detail with her son. Risks include but are not limited to bleeding, infection, deep vein thrombosis, pulmonary embolism, nerve or vascular injury, non-union, repeat operation, persistent pain, weakness, stiffness and death. He understands and is eager to proceed.

## 2019-05-11 NOTE — Anesthesia Post-op Follow-up Note (Signed)
Anesthesia QCDR form completed.        

## 2019-05-11 NOTE — Anesthesia Preprocedure Evaluation (Addendum)
Anesthesia Evaluation  Patient identified by MRN, date of birth, ID band Patient awake    Reviewed: Allergy & Precautions, NPO status , Patient's Chart, lab work & pertinent test results  History of Anesthesia Complications Negative for: history of anesthetic complications  Airway   TM Distance: >3 FB    Comment: Unable to assess Mallampati.  Pt extremely hard of hearing, appears unable to read lips. Dental  (+) Missing   Pulmonary neg pulmonary ROS, neg sleep apnea, neg COPD,           Cardiovascular hypertension, (-) CAD, (-) Past MI, (-) Cardiac Stents and (-) CABG      Neuro/Psych neg Seizures PSYCHIATRIC DISORDERS Depression Dementia negative neurological ROS     GI/Hepatic negative GI ROS, Neg liver ROS,   Endo/Other  diabetes  Renal/GU Renal InsufficiencyRenal disease     Musculoskeletal  (+) Arthritis ,   Abdominal   Peds  Hematology negative hematology ROS (+)   Anesthesia Other Findings Deaf  Past Medical History: No date: Alzheimer's dementia (HCC) No date: Arthritis No date: Cardiomegaly No date: Chronic kidney disease No date: Depression No date: Diabetes mellitus without complication (HCC) No date: Fibrocystic breast No date: Hearing loss No date: Hyperlipidemia No date: Hypertension No date: Vertigo   Reproductive/Obstetrics                           Lab Results  Component Value Date   WBC 12.6 (H) 05/11/2019   HGB 7.9 (L) 05/11/2019   HCT 28.0 (L) 05/11/2019   MCV 70.0 (L) 05/11/2019   PLT 305 05/11/2019    Anesthesia Physical Anesthesia Plan  ASA: III  Anesthesia Plan: Spinal   Post-op Pain Management:    Induction:   PONV Risk Score and Plan: 2  Airway Management Planned: Natural Airway and Nasal Cannula  Additional Equipment:   Intra-op Plan:   Post-operative Plan:   Informed Consent: I have reviewed the patients History and Physical,  chart, labs and discussed the procedure including the risks, benefits and alternatives for the proposed anesthesia with the patient or authorized representative who has indicated his/her understanding and acceptance.     Dental advisory given  Plan Discussed with: CRNA and Anesthesiologist  Anesthesia Plan Comments: (Consent obtained by Dr. Alver Fisher via phone conversation with [facility])       Anesthesia Quick Evaluation

## 2019-05-11 NOTE — Transfer of Care (Signed)
Immediate Anesthesia Transfer of Care Note  Patient: Caroline Wilkerson  Procedure(s) Performed: OPEN REDUCTION INTERNAL FIXATION (ORIF) DISTAL FEMUR FRACTURE (Right Leg Upper)  Patient Location: PACU  Anesthesia Type:Spinal  Level of Consciousness: awake, alert , oriented and patient cooperative  Airway & Oxygen Therapy: Patient Spontanous Breathing and Patient connected to face mask oxygen  Post-op Assessment: Report given to RN and Post -op Vital signs reviewed and stable  Post vital signs: Reviewed and stable  Last Vitals:  Vitals Value Taken Time  BP    Temp    Pulse    Resp    SpO2      Last Pain:  Vitals:   05/11/19 1404  TempSrc: Axillary  PainSc:       Patients Stated Pain Goal: 0 (05/11/19 0420)  Complications: No apparent anesthesia complications

## 2019-05-11 NOTE — Op Note (Addendum)
05/11/2019  7:33 PM  PATIENT:  Caroline Wilkerson    PRE-OPERATIVE DIAGNOSIS:  RIGHT PERIPROSTHETIC DISTAL FEMUR FRACTURE  POST-OPERATIVE DIAGNOSIS:  Same  PROCEDURE:  OPEN REDUCTION INTERNAL FIXATION (ORIF) DISTAL FEMUR PERIPROSTHETIC FRACTURE, RIGHT  SURGEON:  Lyndle Herrlich, MD  ANESTHESIA: spinal  PREOPERATIVE INDICATIONS:  [name] is a  85 y.o. female with a diagnosis of RIGHT PERIPROSTHETIC DISTAL FEMUR FRACTURE who elected for surgical management after extensive discussion of  the risks benefits and alternatives  with the patient preoperatively including but not limited to the risks of infection, bleeding, nerve injury, cardiopulmonary complications, the need for revision surgery, among others, and the patient was willing to proceed.  EBL: 50 cc  OPERATIVE IMPLANTS: Synthes 8 hole distal femur plate, 5 distal locking screws, 1 lag screw, 2 proximal cortical and 2 proximal locking screws  OPERATIVE FINDINGS: comminuted, displaced periprosthetic distal femur fracture  OPERATIVE PROCEDURE: The patient was brought to the operating room and underwent satisfactory anesthesia and was placed in the supine position on the HANA table with the operative leg in traction and the contralateral in a well leg holder.  The operative leg was prepped and draped in sterile fashion.  A longitudinal lateral incision was made over the distal third of the femur with dissection carried out sharply through fascia down to bone.  The vastus lateralis was lifted anteriorly to expose the distal shaft.  The soft tissues were debrided off the lateral condyle, allowing elevation of the patella.  Irrigation was used. The fracture fragments were manipulated into the best possible position due to the comminution.  A Synthes 8 hole periprosthetic distal femur plate was aligned along the femur.  It was temporarily fixated with a large bone holding clamp.  The central screw was placed through the distal portion of the  plate to bring the plate down to the bone.  The femur was reduced and the distal locking screws were placed.  Fluoroscopy showed overall good alignment on AP and lateral views with minimal recurvatum.  A lag screw was placed across the fracture site. A separate lateral incision was made and two cortical and two locking screws were placed proximally.  Fluoroscopy showed good alignment on AP and lateral views. Wound was again irrigated and closed with #2 Quill on fascia, 2-0 Vicryl on subcutaneous tissue and staples on all skin areas.  A sterile dressing was applied.  Sponge and needle count was correct.  Soft long-leg dressing and knee immobilizer were applied.  The patient was awakened and taken to recovery in good condition.   Cassell Smiles, MD

## 2019-05-11 NOTE — Anesthesia Procedure Notes (Signed)
Spinal  Patient location during procedure: OR Start time: 05/11/2019 5:00 PM End time: 05/11/2019 5:20 PM Staffing Resident/CRNA: Nelda Marseille, CRNA Performed: resident/CRNA  Preanesthetic Checklist Completed: patient identified, site marked, surgical consent, pre-op evaluation, timeout performed, IV checked, risks and benefits discussed and monitors and equipment checked Spinal Block Patient position: sitting Prep: Betadine Patient monitoring: heart rate, continuous pulse ox, blood pressure and cardiac monitor Approach: midline Location: L3-4 Injection technique: single-shot Needle Needle type: Whitacre and Introducer  Needle gauge: 25 G Needle length: 9 cm Assessment Sensory level: T10 Additional Notes Negative paresthesia. Negative blood return. Positive free-flowing CSF. Expiration date of kit checked and confirmed. Patient tolerated procedure well, without complications.

## 2019-05-12 LAB — CBC
HCT: 27.4 % — ABNORMAL LOW (ref 36.0–46.0)
Hemoglobin: 8 g/dL — ABNORMAL LOW (ref 12.0–15.0)
MCH: 21.7 pg — ABNORMAL LOW (ref 26.0–34.0)
MCHC: 29.2 g/dL — ABNORMAL LOW (ref 30.0–36.0)
MCV: 74.3 fL — ABNORMAL LOW (ref 80.0–100.0)
Platelets: 260 10*3/uL (ref 150–400)
RBC: 3.69 MIL/uL — ABNORMAL LOW (ref 3.87–5.11)
RDW: 21 % — ABNORMAL HIGH (ref 11.5–15.5)
WBC: 13.2 10*3/uL — ABNORMAL HIGH (ref 4.0–10.5)
nRBC: 0 % (ref 0.0–0.2)

## 2019-05-12 LAB — TYPE AND SCREEN
ABO/RH(D): O POS
Antibody Screen: NEGATIVE
Unit division: 0

## 2019-05-12 LAB — BPAM RBC
Blood Product Expiration Date: 202006232359
ISSUE DATE / TIME: 202005291349
Unit Type and Rh: 5100

## 2019-05-12 MED ORDER — ENOXAPARIN SODIUM 40 MG/0.4ML ~~LOC~~ SOLN
40.0000 mg | SUBCUTANEOUS | Status: DC
  Administered 2019-05-12 – 2019-05-14 (×3): 40 mg via SUBCUTANEOUS
  Filled 2019-05-12 (×3): qty 0.4

## 2019-05-12 MED ORDER — SODIUM CHLORIDE 0.9 % IV SOLN
200.0000 mg | INTRAVENOUS | Status: AC
  Administered 2019-05-12 – 2019-05-13 (×2): 200 mg via INTRAVENOUS
  Filled 2019-05-12 (×2): qty 10

## 2019-05-12 NOTE — Progress Notes (Addendum)
PT Cancellation Note  Patient Details Name: FRANKA MCDANNEL MRN: [id number] DOB: July 07, 1923   Cancelled Treatment:    Reason Eval/Treat Not Completed: Other (comment)(Pt awake, alert, responsive to visual stimulus, makes eye contact, extends a hand to Chartered loss adjuster. Pt smiling, appears comfortable. Pt unable to communicate with patient in a meaningful way with spoke or written language. Will contact family for PLOF, and attempt again tomorrow.)  3:38 PM, 05/12/19 Rosamaria Lints, PT, DPT Physical Therapist - [facility]  [phone] (ASCOM)    Latria Mccarron C 05/12/2019, 3:37 PM

## 2019-05-12 NOTE — Anesthesia Postprocedure Evaluation (Signed)
Anesthesia Post Note  Patient: Caroline Wilkerson  Procedure(s) Performed: OPEN REDUCTION INTERNAL FIXATION (ORIF) DISTAL FEMUR FRACTURE (Right Leg Upper)  Patient location during evaluation: Nursing Unit Anesthesia Type: Spinal Level of consciousness: responds to stimulation and confused (Dementia at baseline) Pain management: pain level controlled Vital Signs Assessment: post-procedure vital signs reviewed and stable Respiratory status: spontaneous breathing, nonlabored ventilation and patient connected to nasal cannula oxygen Cardiovascular status: stable and blood pressure returned to baseline Postop Assessment: no headache, no apparent nausea or vomiting and patient able to bend at knees Anesthetic complications: no     Last Vitals:  Vitals:   05/12/19 0030 05/12/19 0748  BP: 120/73 124/61  Pulse: 66 72  Resp: 18 16  Temp: 36.8 C 36.9 C  SpO2: 94% 92%    Last Pain:  Vitals:   05/12/19 0748  TempSrc: Axillary  PainSc:                  Lenard Simmer

## 2019-05-12 NOTE — Progress Notes (Signed)
  Subjective:  Patient reports pain as moderate.  She is unable to follow commands.  Objective:   VITALS:   Vitals:   05/12/19 0030 05/12/19 0748 05/12/19 1209 05/12/19 1358  BP: 120/73 124/61 120/65 123/60  Pulse: 66 72 78 79  Resp: 18 16 17 15   Temp: 98.2 F (36.8 C) 98.4 F (36.9 C) 97.6 F (36.4 C) 97.7 F (36.5 C)  TempSrc: Axillary Axillary Axillary Axillary  SpO2: 94% 92% 92% 92%  Weight:      Height:        PHYSICAL EXAM:  Neurovascular intact Incision: dressing C/D/I Compartment soft  LABS  Results for orders placed or performed during the hospital encounter of 05/10/19 (from the past 24 hour(s))  Glucose, capillary     Status: None   Collection Time: 05/11/19  7:27 PM  Result Value Ref Range   Glucose-Capillary 76 70 - 99 mg/dL  CBC     Status: Abnormal   Collection Time: 05/12/19  7:44 AM  Result Value Ref Range   WBC 13.2 (H) 4.0 - 10.5 K/uL   RBC 3.69 (L) 3.87 - 5.11 MIL/uL   Hemoglobin 8.0 (L) 12.0 - 15.0 g/dL   HCT 78.5 (L) 88.5 - 02.7 %   MCV 74.3 (L) 80.0 - 100.0 fL   MCH 21.7 (L) 26.0 - 34.0 pg   MCHC 29.2 (L) 30.0 - 36.0 g/dL   RDW 74.1 (H) 28.7 - 86.7 %   Platelets 260 150 - 400 K/uL   nRBC 0.0 0.0 - 0.2 %    Dg C-arm 1-60 Min  Result Date: 05/11/2019 CLINICAL DATA:  ORIF EXAM: RIGHT FEMUR 2 VIEWS; DG C-ARM 61-120 MIN COMPARISON:  05/10/2019 FINDINGS: Six low resolution intraoperative spot views of the right femur. Total fluoroscopy time was 45 seconds. Prior right knee replacement. Surgical plate and multiple screw fixation of comminuted distal femoral periprosthetic fracture with mild residual displacement. Vascular calcifications. IMPRESSION: Intraoperative fluoroscopic assistance provided during surgical fixation of distal femoral fracture Electronically Signed   By: Jasmine Pang M.D.   On: 05/11/2019 19:26   Dg C-arm 1-60 Min  Result Date: 05/11/2019 CLINICAL DATA:  ORIF EXAM: RIGHT FEMUR 2 VIEWS; DG C-ARM 61-120 MIN COMPARISON:   05/10/2019 FINDINGS: Six low resolution intraoperative spot views of the right femur. Total fluoroscopy time was 45 seconds. Prior right knee replacement. Surgical plate and multiple screw fixation of comminuted distal femoral periprosthetic fracture with mild residual displacement. Vascular calcifications. IMPRESSION: Intraoperative fluoroscopic assistance provided during surgical fixation of distal femoral fracture Electronically Signed   By: Jasmine Pang M.D.   On: 05/11/2019 19:26   Dg Femur, Min 2 Views Right  Result Date: 05/11/2019 CLINICAL DATA:  ORIF EXAM: RIGHT FEMUR 2 VIEWS; DG C-ARM 61-120 MIN COMPARISON:  05/10/2019 FINDINGS: Six low resolution intraoperative spot views of the right femur. Total fluoroscopy time was 45 seconds. Prior right knee replacement. Surgical plate and multiple screw fixation of comminuted distal femoral periprosthetic fracture with mild residual displacement. Vascular calcifications. IMPRESSION: Intraoperative fluoroscopic assistance provided during surgical fixation of distal femoral fracture Electronically Signed   By: Jasmine Pang M.D.   On: 05/11/2019 19:26    Assessment/Plan: 1 Day Post-Op   Active Problems:   Displaced fracture of distal epiphysis of right femur (HCC)   Up with therapy to chair NWB on the right leg Discharge planning   Lyndle Herrlich , MD 05/12/2019, 4:29 PM

## 2019-05-12 NOTE — Progress Notes (Signed)
[facility]   PATIENT NAME: Caroline HoarDoris Wilkerson    MR#:  [id number]  DATE OF BIRTH:  June 28, 1923  SUBJECTIVE:  CHIEF COMPLAINT:   Chief Complaint  Patient presents with  . Leg Pain   -  REVIEW OF SYSTEMS:  Review of Systems  Unable to perform ROS: Dementia    DRUG ALLERGIES:   Allergies  Allergen Reactions  . Codeine Diarrhea, Nausea And Vomiting and Other (See Comments)    Nausea   . Hydrocodone Nausea Only    VITALS:  Blood pressure 120/65, pulse 78, temperature 97.6 F (36.4 C), temperature source Axillary, resp. rate 17, height 5\' 4"  (1.626 m), weight 63.5 kg, SpO2 92 %.  PHYSICAL EXAMINATION:  Physical Exam  GENERAL:  85 y.o.-year-old elderly  patient lying in the bed with no acute distress.  EYES: Pupils equal, round, reactive to light and accommodation. No scleral icterus. Extraocular muscles intact.  HEENT: Head atraumatic, normocephalic. Oropharynx and nasopharynx clear.  NECK:  Supple, no jugular venous distention. No thyroid enlargement, no tenderness.  LUNGS: Normal breath sounds bilaterally, no wheezing, rales,rhonchi or crepitation. No use of accessory muscles of respiration.  Dereased bibasilar breath sounds CARDIOVASCULAR: S1, S2 normal. No  rubs, or gallops.  2/6 systolic murmur is present ABDOMEN: Soft, nontender, nondistended. Bowel sounds present. No organomegaly or mass.  EXTREMITIES: No  cyanosis, or clubbing.  A immobilizer is present to the right leg NEUROLOGIC: Cranial nerves II through XII are intact.  Moving both upper extremities well, confused and not following commands.  Right leg in an immobilizer. Sensation intact. Gait not checked.  PSYCHIATRIC: The patient is alert, not oriented SKIN: No obvious rash, lesion, or ulcer.    LABORATORY PANEL:   CBC Recent Labs  Lab 05/12/19 0744  WBC 13.2*  HGB 8.0*  HCT 27.4*  PLT 260    ------------------------------------------------------------------------------------------------------------------  Chemistries  Recent Labs  Lab 05/11/19 0548  NA 139  K 3.6  CL 100  CO2 26  GLUCOSE 115*  BUN 31*  CREATININE 1.00  CALCIUM 8.9   ------------------------------------------------------------------------------------------------------------------  Cardiac Enzymes No results for input(s): TROPONINI in the last 168 hours. ------------------------------------------------------------------------------------------------------------------  RADIOLOGY:  Dg Chest 1 View  Result Date: 05/10/2019 CLINICAL DATA:  Hypertension.  Knee fracture after fall. EXAM: CHEST  1 VIEW COMPARISON:  Radiograph February 11, 2016. FINDINGS: Stable cardiomegaly. Atherosclerosis of thoracic aorta is noted. Stable large hiatal hernia is noted. No pneumothorax or pleural effusion is noted. Both lungs are clear. The visualized skeletal structures are unremarkable. IMPRESSION: Stable large hiatal hernia.  No acute abnormality seen. Aortic Atherosclerosis (ICD10-I70.0). Electronically Signed   By: Lupita RaiderJames  Green Jr M.D.   On: 05/10/2019 13:53   Dg C-arm 1-60 Min  Result Date: 05/11/2019 CLINICAL DATA:  ORIF EXAM: RIGHT FEMUR 2 VIEWS; DG C-ARM 61-120 MIN COMPARISON:  05/10/2019 FINDINGS: Six low resolution intraoperative spot views of the right femur. Total fluoroscopy time was 45 seconds. Prior right knee replacement. Surgical plate and multiple screw fixation of comminuted distal femoral periprosthetic fracture with mild residual displacement. Vascular calcifications. IMPRESSION: Intraoperative fluoroscopic assistance provided during surgical fixation of distal femoral fracture Electronically Signed   By: Jasmine PangKim  Fujinaga M.D.   On: 05/11/2019 19:26   Dg C-arm 1-60 Min  Result Date: 05/11/2019 CLINICAL DATA:  ORIF EXAM: RIGHT FEMUR 2 VIEWS; DG C-ARM 61-120 MIN COMPARISON:  05/10/2019 FINDINGS: Six low resolution  intraoperative spot views of the right femur. Total fluoroscopy time was 45  seconds. Prior right knee replacement. Surgical plate and multiple screw fixation of comminuted distal femoral periprosthetic fracture with mild residual displacement. Vascular calcifications. IMPRESSION: Intraoperative fluoroscopic assistance provided during surgical fixation of distal femoral fracture Electronically Signed   By: Jasmine Pang M.D.   On: 05/11/2019 19:26   Dg Femur, Min 2 Views Right  Result Date: 05/11/2019 CLINICAL DATA:  ORIF EXAM: RIGHT FEMUR 2 VIEWS; DG C-ARM 61-120 MIN COMPARISON:  05/10/2019 FINDINGS: Six low resolution intraoperative spot views of the right femur. Total fluoroscopy time was 45 seconds. Prior right knee replacement. Surgical plate and multiple screw fixation of comminuted distal femoral periprosthetic fracture with mild residual displacement. Vascular calcifications. IMPRESSION: Intraoperative fluoroscopic assistance provided during surgical fixation of distal femoral fracture Electronically Signed   By: Jasmine Pang M.D.   On: 05/11/2019 19:26   Dg Femur Min 2 Views Right  Result Date: 05/10/2019 CLINICAL DATA:  Fall. EXAM: RIGHT FEMUR 2 VIEWS COMPARISON:  None. FINDINGS: Status post right hip arthroplasty. Vascular calcifications are noted. Severely displaced and probably comminuted fracture is seen involving the distal right femoral shaft just above femoral prosthesis of right total knee arthroplasty. IMPRESSION: Severely displaced and probably comminuted fracture involving distal right femoral shaft just above femoral prosthesis. Electronically Signed   By: Lupita Raider M.D.   On: 05/10/2019 13:54    EKG:   Orders placed or performed during the hospital encounter of 05/10/19  . ED EKG  . ED EKG    ASSESSMENT AND PLAN:   85 year old female with severe dementia, depression, diabetes, hypertension brought from assisted living facility with right distal femur fracture.  1.   Right distal femur periprosthetic fracture-concern for possible fall.  Prior history of total knee replacement. -Appreciate orthopedics consult.  Status post surgery yesterday, postop day 1 today -Continue pain control.  Physical therapy consult today. -Might need placement.  Patient was wheelchair-bound prior to the hospitalization and was followed with hospice.  But due to concern for falls, family interested in placement at a skilled nursing facility -Social worker has been consulted.  When she goes to long-term, plan is to continue hospice at the time  2.  Hypokalemia-replaced  3.  Acute on chronic anemia-received 1 unit transfusion this hospitalization.  Severe iron deficiency on labs.  Receiving IV iron in the hospital.  Oral iron at discharge  4.  Hypertension-continue home medication.  5.  DVT prophylaxis-we will start Lovenox since hemoglobin is stable  Updated son over the phone today.   All the records are reviewed and case discussed with Care Management/Social Workerr. Management plans discussed with the patient, family and they are in agreement.  CODE STATUS: Full code  TOTAL TIME TAKING CARE OF THIS PATIENT: 38 minutes.   POSSIBLE D/C IN 38 DAYS, DEPENDING ON CLINICAL CONDITION.   Enid Baas M.D on 05/12/2019 at 12:29 PM  Between 7am to 6pm - Pager - [phone]  After 6pm go to [email or web address] - Social research officer, government  [facility]  Office  [phone]  CC: Primary care physician; [facility]

## 2019-05-12 NOTE — Progress Notes (Signed)
PT Cancellation Note  Patient Details Name: DANAZIA DETORE MRN: [id number] DOB: [birth year]   Cancelled Treatment:    Reason Eval/Treat Not Completed: Fatigue/lethargy limiting ability to participate;Patient's level of consciousness. Chart reviewed, RN consulted. Pt has been heavily asleep since last night. Noted late surgical time yesterday, pt now only 12 hours post op. Will return in afternoon to see if patient is more easily made alert and able to participate. Of note: [facility] shows a noted from Jan 2019 regarding renewal of community hospice services. Since baseline level info is limited and pt has been with hospice for >1 year, will await RNCM/CSW note for additional detail about patient's baseline level of function. This will help facilitate the role of rehab services for this patient while admitted and help to establish meaningful goals, although it is likely that the patient will not be eligible for short term rehab if still being followed by hospice.   9:31 AM, 05/12/19 Rosamaria Lints, PT, DPT Physical Therapist - [facility]  [phone] (ASCOM)    Freda Jaquith C 05/12/2019, 9:27 AM

## 2019-05-13 ENCOUNTER — Encounter: Payer: Self-pay | Admitting: Orthopedic Surgery

## 2019-05-13 LAB — CBC
HCT: 29.2 % — ABNORMAL LOW (ref 36.0–46.0)
Hemoglobin: 8.5 g/dL — ABNORMAL LOW (ref 12.0–15.0)
MCH: 21.4 pg — ABNORMAL LOW (ref 26.0–34.0)
MCHC: 29.1 g/dL — ABNORMAL LOW (ref 30.0–36.0)
MCV: 73.6 fL — ABNORMAL LOW (ref 80.0–100.0)
Platelets: 298 10*3/uL (ref 150–400)
RBC: 3.97 MIL/uL (ref 3.87–5.11)
RDW: 21.8 % — ABNORMAL HIGH (ref 11.5–15.5)
WBC: 12.8 10*3/uL — ABNORMAL HIGH (ref 4.0–10.5)
nRBC: 0 % (ref 0.0–0.2)

## 2019-05-13 NOTE — Plan of Care (Signed)
Patient has been calm and cooperative all night. Patient shift in bed by herself. She does not reply to questions asked.    Problem: Education: Goal: Verbalization of understanding the information provided (i.e., activity precautions, restrictions, etc) will improve Outcome: Progressing Goal: Individualized Educational Video(s) Outcome: Progressing   Problem: Activity: Goal: Ability to ambulate and perform ADLs will improve Outcome: Progressing   Problem: Clinical Measurements: Goal: Postoperative complications will be avoided or minimized Outcome: Progressing   Problem: Self-Concept: Goal: Ability to maintain and perform role responsibilities to the fullest extent possible will improve Outcome: Progressing   Problem: Pain Management: Goal: Pain level will decrease Outcome: Progressing

## 2019-05-13 NOTE — Progress Notes (Signed)
[employer] - [place] [facility]   PATIENT NAME: Caroline HoarDoris Iwan    MR#:  [id number]  DATE OF BIRTH:  [birth year]  SUBJECTIVE:  CHIEF COMPLAINT:   Chief Complaint  Patient presents with  . Leg Pain   -Patient with dementia, pleasantly confused.  Not following commands  REVIEW OF SYSTEMS:  Review of Systems  Unable to perform ROS: Dementia    DRUG ALLERGIES:   Allergies  Allergen Reactions  . Codeine Diarrhea, Nausea And Vomiting and Other (See Comments)    Nausea   . Hydrocodone Nausea Only    VITALS:  Blood pressure 117/64, pulse 78, temperature 97.8 F (36.6 C), resp. rate 14, height 5\' 4"  (1.626 m), weight 63.5 kg, SpO2 93 %.  PHYSICAL EXAMINATION:  Physical Exam  GENERAL:  85 y.o.-year-old elderly  patient lying in the bed with no acute distress.  EYES: Pupils equal, round, reactive to light and accommodation. No scleral icterus. Extraocular muscles intact.  HEENT: Head atraumatic, normocephalic. Oropharynx and nasopharynx clear.  NECK:  Supple, no jugular venous distention. No thyroid enlargement, no tenderness.  LUNGS: Normal breath sounds bilaterally, no wheezing, rales,rhonchi or crepitation. No use of accessory muscles of respiration.  Dereased bibasilar breath sounds CARDIOVASCULAR: S1, S2 normal. No  rubs, or gallops.  2/6 systolic murmur is present ABDOMEN: Soft, nontender, nondistended. Bowel sounds present. No organomegaly or mass.  EXTREMITIES: No  cyanosis, or clubbing.  An immobilizer is present to the right leg NEUROLOGIC: Cranial nerves II through XII are intact.  Moving both upper extremities well, confused and not following commands.  Right leg in an immobilizer. Sensation intact. Gait not checked.  PSYCHIATRIC: The patient is alert, not oriented SKIN: No obvious rash, lesion, or ulcer.    LABORATORY PANEL:   CBC Recent Labs  Lab 05/13/19 0459  WBC 12.8*  HGB 8.5*  HCT 29.2*  PLT 298    ------------------------------------------------------------------------------------------------------------------  Chemistries  Recent Labs  Lab 05/11/19 0548  NA 139  K 3.6  CL 100  CO2 26  GLUCOSE 115*  BUN 31*  CREATININE 1.00  CALCIUM 8.9   ------------------------------------------------------------------------------------------------------------------  Cardiac Enzymes No results for input(s): TROPONINI in the last 168 hours. ------------------------------------------------------------------------------------------------------------------  RADIOLOGY:  Dg C-arm 1-60 Min  Result Date: 05/11/2019 CLINICAL DATA:  ORIF EXAM: RIGHT FEMUR 2 VIEWS; DG C-ARM 61-120 MIN COMPARISON:  05/10/2019 FINDINGS: Six low resolution intraoperative spot views of the right femur. Total fluoroscopy time was 45 seconds. Prior right knee replacement. Surgical plate and multiple screw fixation of comminuted distal femoral periprosthetic fracture with mild residual displacement. Vascular calcifications. IMPRESSION: Intraoperative fluoroscopic assistance provided during surgical fixation of distal femoral fracture Electronically Signed   By: Jasmine PangKim  Fujinaga M.D.   On: 05/11/2019 19:26   Dg C-arm 1-60 Min  Result Date: 05/11/2019 CLINICAL DATA:  ORIF EXAM: RIGHT FEMUR 2 VIEWS; DG C-ARM 61-120 MIN COMPARISON:  05/10/2019 FINDINGS: Six low resolution intraoperative spot views of the right femur. Total fluoroscopy time was 45 seconds. Prior right knee replacement. Surgical plate and multiple screw fixation of comminuted distal femoral periprosthetic fracture with mild residual displacement. Vascular calcifications. IMPRESSION: Intraoperative fluoroscopic assistance provided during surgical fixation of distal femoral fracture Electronically Signed   By: Jasmine PangKim  Fujinaga M.D.   On: 05/11/2019 19:26   Dg Femur, Min 2 Views Right  Result Date: 05/11/2019 CLINICAL DATA:  ORIF EXAM: RIGHT FEMUR 2 VIEWS; DG C-ARM 61-120  MIN COMPARISON:  05/10/2019 FINDINGS: Six low resolution intraoperative spot views of  the right femur. Total fluoroscopy time was 45 seconds. Prior right knee replacement. Surgical plate and multiple screw fixation of comminuted distal femoral periprosthetic fracture with mild residual displacement. Vascular calcifications. IMPRESSION: Intraoperative fluoroscopic assistance provided during surgical fixation of distal femoral fracture Electronically Signed   By: Jasmine Pang M.D.   On: 05/11/2019 19:26    EKG:   Orders placed or performed during the hospital encounter of 05/10/19  . ED EKG  . ED EKG    ASSESSMENT AND PLAN:   85 year old female with severe dementia, depression, diabetes, hypertension brought from assisted living facility with right distal femur fracture.  1.  Right distal femur periprosthetic fracture-concern for possible fall.  Prior history of total knee replacement. -Appreciate orthopedics consult.  Status post surgery , postop day 2 today -Continue pain control.  Physical therapy consulted-but patient unable to participate with therapy given her dementia and inability to follow commands.. -Might need placement to long-term care.  - Patient was wheelchair-bound prior to the hospitalization and was followed with hospice.  But due to concern for falls, family interested in placement at a long-term care facility and continue hospice. -Social worker has been consulted.    2.  Hypokalemia-replaced  3.  Acute on chronic anemia-received 1 unit transfusion this hospitalization.  Severe iron deficiency on labs.  Receiving IV iron in the hospital.  Oral iron at discharge - Hemoglobin is stable  4.  Hypertension-continue home medication.  5.  DVT prophylaxis- Lovenox since hemoglobin is stable  Updated son over the phone on 05/13/19. He has further questions for Child psychotherapist.  Social worker consult requested  All the records are reviewed and case discussed with Care  Management/Social Workerr. Management plans discussed with the patient, family and they are in agreement.  CODE STATUS: Full code  TOTAL TIME TAKING CARE OF THIS PATIENT: 33 minutes.   POSSIBLE D/C IN 1-2 DAYS, DEPENDING ON CLINICAL CONDITION.   Enid Baas M.D on 05/13/2019 at 9:53 AM  Between 7am to 6pm - Pager - [phone]  After 6pm go to [email or web address] - Social research officer, government  [facility] [place] [facility]  Office  [phone]  CC: Primary care physician; [facility]

## 2019-05-13 NOTE — Evaluation (Addendum)
Physical Therapy Evaluation Patient Details Name: Caroline Wilkerson K Shi MRN: [id number] DOB: 1923/03/03 Today's Date: 05/13/2019   History of Present Illness  Monico HoarDoris Sayas is a 89yoF who sustained a fall at ALF, noted to have a distal femoral shaft fracture: ORIF on RLE 5/29. PTA pt was total care, has bene followed by baseline >2 years, nonAMB x 2years, deaf, dementia with heavy communication deficits.   Clinical Impression  Pt admitted with above diagnosis. Pt currently with functional limitations due to the deficits listed below (see "PT Problem List"). Upon entry, pt in bed, awake, interactive, pleasant. Author still unable to communicate in meaningful way with patient in spite of combined peak-volume speaking with peak-proximity mouth-to-ear methodology. Pt also remains non-responsive to large written text, which DTR asserts has also been her experience in the past. Pt assisted to EOB TotalA, pt seems to be on-board. Weak at EOB, tortuous posturing of the trunk which I suspect is chronic, requires modA for trunk support. TotalA squat pivot to chair, pt again largely attempting to participate bringing trunk closer to author and grabbing authors arms. Only mild vocalization mid transfer that would indicate pain, otherwise tolerated well. Once in chair, pt still smiling (as typical) and says 'thank you' multiple times. Incontinence of bowel noted while reconnecting purewick extfemcath, RN notified. This patient is not appropriate for PT services as she is not able to participate. Her cognitive limitations are chronic and she has been nonambulatory for >2years. Anticipate best plan is for return to ALF with continued caregiver support as PTA. Per DTR pt still followed by hospice services. DC recs discussed with DTR and she is agreeable. PT will signoff at this time. Strongly recommend mechanical lift transfers for OOB with nursing.    Follow Up Recommendations No PT follow up    Equipment Recommendations   None recommended by PT    Recommendations for Other Services       Precautions / Restrictions Precautions Precautions: Fall Required Braces or Orthoses: Knee Immobilizer - Right Knee Immobilizer - Right: On when out of bed or walking Restrictions RLE Weight Bearing: Non weight bearing      Mobility  Bed Mobility Overal bed mobility: Needs Assistance Bed Mobility: Supine to Sit     Supine to sit: +2 for physical assistance;Total assist;+2 for safety/equipment        Transfers Overall transfer level: Needs assistance Equipment used: None Transfers: Stand Pivot Transfers   Stand pivot transfers: Total assist;+2 physical assistance;+2 safety/equipment          Ambulation/Gait Ambulation/Gait assistance: (pt is nonAMB at baseline >2years)              Stairs            Wheelchair Mobility    Modified Rankin (Stroke Patients Only)       Balance Overall balance assessment: (difficulty with trunk support, Rt lateral leaning, poor righting, modA for support)                                           Pertinent Vitals/Pain Pain Assessment: Faces Faces Pain Scale: Hurts little more Pain Location: Unknown, likely Right knee. Has baseline Left knee pain Pain Descriptors / Indicators: Grimacing Pain Intervention(s): Limited activity within patient's tolerance;Monitored during session;Repositioned    Home Living Family/patient expects to be discharged to:: Skilled nursing facility  Additional Comments: [facility], followed by community hospice services    Prior Function Level of Independence: Needs assistance   Gait / Transfers Assistance Needed: WC, TotalA for transfers  ADL's / Homemaking Assistance Needed: Total Care         Hand Dominance        Extremity/Trunk Assessment   Upper Extremity Assessment Upper Extremity Assessment: Generalized weakness(seems sufficient for self feeding )    Lower  Extremity Assessment Lower Extremity Assessment: Generalized weakness    Cervical / Trunk Assessment Cervical / Trunk Assessment: Kyphotic  Communication   Communication: Receptive difficulties;Deaf  Cognition Arousal/Alertness: Awake/alert Behavior During Therapy: WFL for tasks assessed/performed Overall Cognitive Status: History of cognitive impairments - at baseline(advanced dementia with language difficulty, but interactive and pleasant)                                        General Comments      Exercises     Assessment/Plan    PT Assessment Patent does not need any further PT services  PT Problem List         PT Treatment Interventions      PT Goals (Current goals can be found in the Care Plan section)  Acute Rehab PT Goals PT Goal Formulation: All assessment and education complete, DC therapy    Frequency     Barriers to discharge        Co-evaluation               AM-PAC PT "6 Clicks" Mobility  Outcome Measure Help needed turning from your back to your side while in a flat bed without using bedrails?: Total Help needed moving from lying on your back to sitting on the side of a flat bed without using bedrails?: Total Help needed moving to and from a bed to a chair (including a wheelchair)?: Total Help needed standing up from a chair using your arms (e.g., wheelchair or bedside chair)?: Total Help needed to walk in hospital room?: Total Help needed climbing 3-5 steps with a railing? : Total 6 Click Score: 6    End of Session Equipment Utilized During Treatment: Right knee immobilizer Activity Tolerance: Patient tolerated treatment well;Patient limited by fatigue Patient left: in chair Nurse Communication: Mobility status;Other (comment)(pt had bowel movement ) PT Visit Diagnosis: Other abnormalities of gait and mobility (R26.89);History of falling (Z91.81)    Time: 9509-3267 PT Time Calculation (min) (ACUTE ONLY): 19  min   Charges:   PT Evaluation $PT Eval Low Complexity: 1 Low          1:09 PM, 05/13/19 Rosamaria Lints, PT, DPT Physical Therapist - [facility]  [phone] (ASCOM)   Buccola,Allan C 05/13/2019, 12:59 PM

## 2019-05-13 NOTE — Plan of Care (Signed)
  Problem: Education: Goal: Verbalization of understanding the information provided (i.e., activity precautions, restrictions, etc) will improve 05/13/2019 1341 by Greer Ee, RN Outcome: Not Progressing 05/13/2019 1341 by Greer Ee, RN Outcome: Progressing Goal: Individualized Educational Video(s) 05/13/2019 1341 by Greer Ee, RN Outcome: Not Progressing 05/13/2019 1341 by Greer Ee, RN Outcome: Progressing

## 2019-05-13 NOTE — Progress Notes (Signed)
Subjective:  Patient with dementia, unable to follow commands.  Objective:   VITALS:   Vitals:   05/12/19 1358 05/12/19 2005 05/13/19 0531 05/13/19 0835  BP: 123/60 113/63 107/63 117/64  Pulse: 79 81 83 78  Resp: 15 14 14 14   Temp: 97.7 F (36.5 C) 98.9 F (37.2 C) 98.8 F (37.1 C) 97.8 F (36.6 C)  TempSrc: Axillary Oral Oral   SpO2: 92% 90% 90% 93%  Weight:      Height:        PHYSICAL EXAM:  Neurovascular intact Incision: dressing C/D/I Compartment soft  LABS  Results for orders placed or performed during the hospital encounter of 05/10/19 (from the past 24 hour(s))  CBC     Status: Abnormal   Collection Time: 05/13/19  4:59 AM  Result Value Ref Range   WBC 12.8 (H) 4.0 - 10.5 K/uL   RBC 3.97 3.87 - 5.11 MIL/uL   Hemoglobin 8.5 (L) 12.0 - 15.0 g/dL   HCT 16.129.2 (L) 09.636.0 - 04.546.0 %   MCV 73.6 (L) 80.0 - 100.0 fL   MCH 21.4 (L) 26.0 - 34.0 pg   MCHC 29.1 (L) 30.0 - 36.0 g/dL   RDW 40.921.8 (H) 81.111.5 - 91.415.5 %   Platelets 298 150 - 400 K/uL   nRBC 0.0 0.0 - 0.2 %    Dg C-arm 1-60 Min  Result Date: 05/11/2019 CLINICAL DATA:  ORIF EXAM: RIGHT FEMUR 2 VIEWS; DG C-ARM 61-120 MIN COMPARISON:  05/10/2019 FINDINGS: Six low resolution intraoperative spot views of the right femur. Total fluoroscopy time was 45 seconds. Prior right knee replacement. Surgical plate and multiple screw fixation of comminuted distal femoral periprosthetic fracture with mild residual displacement. Vascular calcifications. IMPRESSION: Intraoperative fluoroscopic assistance provided during surgical fixation of distal femoral fracture Electronically Signed   By: Jasmine PangKim  Fujinaga M.D.   On: 05/11/2019 19:26   Dg C-arm 1-60 Min  Result Date: 05/11/2019 CLINICAL DATA:  ORIF EXAM: RIGHT FEMUR 2 VIEWS; DG C-ARM 61-120 MIN COMPARISON:  05/10/2019 FINDINGS: Six low resolution intraoperative spot views of the right femur. Total fluoroscopy time was 45 seconds. Prior right knee replacement. Surgical plate and multiple  screw fixation of comminuted distal femoral periprosthetic fracture with mild residual displacement. Vascular calcifications. IMPRESSION: Intraoperative fluoroscopic assistance provided during surgical fixation of distal femoral fracture Electronically Signed   By: Jasmine PangKim  Fujinaga M.D.   On: 05/11/2019 19:26   Dg Femur, Min 2 Views Right  Result Date: 05/11/2019 CLINICAL DATA:  ORIF EXAM: RIGHT FEMUR 2 VIEWS; DG C-ARM 61-120 MIN COMPARISON:  05/10/2019 FINDINGS: Six low resolution intraoperative spot views of the right femur. Total fluoroscopy time was 45 seconds. Prior right knee replacement. Surgical plate and multiple screw fixation of comminuted distal femoral periprosthetic fracture with mild residual displacement. Vascular calcifications. IMPRESSION: Intraoperative fluoroscopic assistance provided during surgical fixation of distal femoral fracture Electronically Signed   By: Jasmine PangKim  Fujinaga M.D.   On: 05/11/2019 19:26    Assessment/Plan: 2 Days Post-Op   Active Problems:   Displaced fracture of distal epiphysis of right femur (HCC)   Up with therapy to chair Discharge planning Aspirin 81 mg daily for DVT prophylaxis once she is transferred She may have staples removed at nursing facility 2 weeks from the discharge date Follow-up in our office in 6 weeks or sooner if any issues arise Non-weight bearing on the right lower extremity Knee immobilizer needed only for transfers, may be off in bed or removed for hygiene purposes   Fayrene FearingJames  Clenton Pare , MD 05/13/2019, 9:57 AM

## 2019-05-14 LAB — GLUCOSE, CAPILLARY: Glucose-Capillary: 108 mg/dL — ABNORMAL HIGH (ref 70–99)

## 2019-05-14 NOTE — NC FL2 (Signed)
[place] MEDICAID FL2 LEVEL OF CARE SCREENING TOOL     IDENTIFICATION  Patient Name: Caroline Wilkerson Birthdate: [birth year] Sex: female Admission Date (Current Location): 05/10/2019  [place] and [place] Number:  Chiropodist and Address:  [facility], [address] [place], [place] [address]      Provider Number: [id number]  Attending Physician Name and Address:  Enid Baas, MD  Relative Name and Phone Number:  [name] [phone], Lydia [phone]    Current Level of Care: Hospital Recommended Level of Care: Skilled Nursing Facility Prior Approval Number:    Date Approved/Denied: 04/20/12 PASRR Number: [id number]  Discharge Plan: SNF    Current Diagnoses: Patient Active Problem List   Diagnosis Date Noted  . Displaced fracture of distal epiphysis of right femur (HCC) 05/10/2019  . SDH (subdural hematoma) (HCC) 12/17/2017  . Cellulitis 02/17/2016  . Generalized weakness 02/17/2016  . Decubitus ulcer of buttock, stage 1 10/21/2015  . Hypertension   . Hyperlipidemia   . Alzheimer's dementia (HCC)     Orientation RESPIRATION BLADDER Height & Weight     Self    Incontinent Weight: 63.5 kg Height:  5\' 4"  (162.6 cm)  BEHAVIORAL SYMPTOMS/MOOD NEUROLOGICAL BOWEL NUTRITION STATUS      Incontinent Diet(regular, thin liquid)  AMBULATORY STATUS COMMUNICATION OF NEEDS Skin   Total Care Verbally Surgical wounds                       Personal Care Assistance Level of Assistance  Total care       Total Care Assistance: Maximum assistance   Functional Limitations Info  Sight, Hearing, Speech Sight Info: Impaired Hearing Info: Impaired Speech Info: Adequate    SPECIAL CARE FACTORS FREQUENCY                       Contractures Contractures Info: Not present    Additional Factors Info  Code Status(full code) Code Status Info: FUll code             Current Medications (05/14/2019):  This is the  current hospital active medication list Current Facility-Administered Medications  Medication Dose Route Frequency Provider Last Rate Last Dose  . 0.9 %  sodium chloride infusion (Manually program via Guardrails IV Fluids)   Intravenous Once Lyndle Herrlich, MD      . 0.9 %  sodium chloride infusion (Manually program via Guardrails IV Fluids)   Intravenous Once Lyndle Herrlich, MD      . acetaminophen (TYLENOL) tablet 325 mg  325 mg Oral Q6H PRN Lyndle Herrlich, MD   325 mg at 05/14/19 0813  . calcium carbonate (TUMS - dosed in mg elemental calcium) chewable tablet 1,250 mg  1,250 mg Oral BID WC Lyndle Herrlich, MD   1,250 mg at 05/14/19 0816  . citalopram (CELEXA) tablet 10 mg  10 mg Oral Daily Lyndle Herrlich, MD   10 mg at 05/14/19 0815  . docusate sodium (COLACE) capsule 100 mg  100 mg Oral BID Lyndle Herrlich, MD   100 mg at 05/13/19 2219  . enoxaparin (LOVENOX) injection 40 mg  40 mg Subcutaneous Q24H Enid Baas, MD   40 mg at 05/13/19 2218  . haloperidol lactate (HALDOL) injection 2 mg  2 mg Intravenous Q6H PRN Lyndle Herrlich, MD      . lactated ringers infusion   Intravenous Continuous Lyndle Herrlich, MD   Stopped at 05/12/19  1419  . loratadine (CLARITIN) tablet 10 mg  10 mg Oral Daily Lyndle HerrlichBowers, James R, MD   10 mg at 05/14/19 0816  . metoCLOPramide (REGLAN) tablet 5 mg  5 mg Oral Q8H PRN Lyndle HerrlichBowers, James R, MD       Or  . metoCLOPramide (REGLAN) injection 5 mg  5 mg Intravenous Q8H PRN Lyndle HerrlichBowers, James R, MD      . mirabegron [facility](MYRBETRIQ) tablet 25 mg  25 mg Oral Daily Lyndle HerrlichBowers, James R, MD   25 mg at 05/14/19 0815  . morphine 2 MG/ML injection 2 mg  2 mg Intravenous Q3H PRN Lyndle HerrlichBowers, James R, MD   2 mg at 05/11/19 0420  . mupirocin ointment (BACTROBAN) 2 %   Nasal BID Lyndle HerrlichBowers, James R, MD      . ondansetron [facility](ZOFRAN) tablet 4 mg  4 mg Oral Q6H PRN Lyndle HerrlichBowers, James R, MD       Or  . ondansetron [facility](ZOFRAN) injection 4 mg  4 mg Intravenous Q6H PRN Lyndle HerrlichBowers, James R, MD      . traMADol Janean Sark(ULTRAM)  tablet 50 mg  50 mg Oral Q6H Lyndle HerrlichBowers, James R, MD   50 mg at 05/14/19 0813  . triamterene-hydrochlorothiazide (MAXZIDE-25) 37.5-25 MG per tablet 2 tablet  2 tablet Oral Daily Lyndle HerrlichBowers, James R, MD   2 tablet at 05/14/19 [id number]     Discharge Medications: Please see discharge summary for a list of discharge medications.  Relevant Imaging Results:  Relevant Lab Results:   Additional Information [id number]  Barrie Dunkereliliah J Molly Savarino, RN

## 2019-05-14 NOTE — Plan of Care (Signed)

## 2019-05-14 NOTE — TOC Initial Note (Signed)
Transition of Care [facility]) - Initial/Assessment Note    Patient Details  Name: Caroline Wilkerson MRN: [id number] Date of Birth: 05-19-1923  Transition of Care [facility]) CM/SW Contact:    Barrie Dunker, RN Phone Number: 05/14/2019, 12:21 PM  Clinical Narrative:                 Had a lengthy conversation with the Caroline Wilkerson who is very [facility], he tells me that she can't go back to the [facility], He has picked up all of her things.  I explained that she will need a long term care facility and that the family will need to get that set up,  He stated that he was calling [facility] and [facility] to possibly get a bed there, I explained that he will need to call them and let me know if he chooses one of them so that we can wok on the discharge. He stated that he is calling both facilities next, he stated that the patient does have Long term care insurance that covers, He will call me back with a plan  Expected Discharge Plan: Long Term Nursing Home Barriers to Discharge: Continued Medical Work up   Patient Goals and CMS Choice Patient states their goals for this hospitalization and ongoing recovery are:: son wants to go to long term care CMS Medicare.gov Compare Post Acute Care list provided to:: Patient Choice offered to / list presented to : Adult Children  Expected Discharge Plan and Services Expected Discharge Plan: Long Term Nursing Home   Discharge Planning Services: CM Consult   Living arrangements for the past 2 months: Assisted Living Facility                                      Prior Living Arrangements/Services Living arrangements for the past 2 months: Assisted Living Facility Lives with:: Facility Resident   Do you feel safe going back to the place where you live?: No      Need for Family Participation in Patient Care: Yes (Comment) Care giver support system in place?: Yes (comment)   Criminal Activity/Legal Involvement Pertinent to Current Situation/Hospitalization: No -  Comment as needed  Activities of Daily Living   ADL Screening (condition at time of admission) Patient's cognitive ability adequate to safely complete daily activities?: No Is the patient deaf or have difficulty hearing?: No Does the patient have difficulty concentrating, remembering, or making decisions?: Yes Patient able to express need for assistance with ADLs?: Yes Does the patient have difficulty dressing or bathing?: Yes Independently performs ADLs?: No Communication: Independent Dressing (OT): Needs assistance Is this a change from baseline?: Pre-admission baseline Grooming: Needs assistance Is this a change from baseline?: Pre-admission baseline Feeding: Needs assistance Is this a change from baseline?: Pre-admission baseline Bathing: Needs assistance Is this a change from baseline?: Pre-admission baseline Toileting: Needs assistance Is this a change from baseline?: Pre-admission baseline In/Out Bed: Needs assistance Is this a change from baseline?: Pre-admission baseline Walks in Home: Dependent Is this a change from baseline?: Pre-admission baseline Weakness of Legs: Both Weakness of Arms/Hands: Both  Permission Sought/Granted Permission sought to share information with : Case Manager Permission granted to share information with : Yes, Verbal Permission Granted              Emotional Assessment Appearance:: Appears stated age Attitude/Demeanor/Rapport: Inconsistent Affect (typically observed): Unable to Assess Orientation: : Oriented to  Self Alcohol / Substance Use: Never Used Psych Involvement: No (comment)  Admission diagnosis:  Fall [W19.XXXA] Closed fracture of right femur, unspecified fracture morphology, unspecified portion of femur, initial encounter [facility](HCC) [S72.91XA] Patient Active Problem List   Diagnosis Date Noted  . Displaced fracture of distal epiphysis of right femur (HCC) 05/10/2019  . SDH (subdural hematoma) (HCC) 12/17/2017  . Cellulitis  02/17/2016  . Generalized weakness 02/17/2016  . Decubitus ulcer of buttock, stage 1 10/21/2015  . Hypertension   . Hyperlipidemia   . Alzheimer's dementia (HCC)    PCP:  [facility] Pharmacy:   [facility] [place] - [address] [place] [address] Phone: [phone] Fax: [phone]     Social Determinants of Health (SDOH) Interventions    Readmission Risk Interventions No flowsheet data found.

## 2019-05-14 NOTE — Progress Notes (Signed)
[facility] - [place] at [facility]   PATIENT NAME: Monico HoarDoris Debruyn    MR#:  [id number]  DATE OF BIRTH:  September 14, 1923  SUBJECTIVE:  CHIEF COMPLAINT:   Chief Complaint  Patient presents with  . Leg Pain   -Patient with dementia, pleasantly confused.  Not following commands - POD #3 today  REVIEW OF SYSTEMS:  Review of Systems  Unable to perform ROS: Dementia    DRUG ALLERGIES:   Allergies  Allergen Reactions  . Codeine Diarrhea, Nausea And Vomiting and Other (See Comments)    Nausea   . Hydrocodone Nausea Only    VITALS:  Blood pressure (!) 126/59, pulse 81, temperature 98.8 F (37.1 C), temperature source Oral, resp. rate 16, height 5\' 4"  (1.626 m), weight 63.5 kg, SpO2 94 %.  PHYSICAL EXAMINATION:  Physical Exam  GENERAL:  85 y.o.-year-old elderly  patient lying in the bed with no acute distress.  EYES: Pupils equal, round, reactive to light and accommodation. No scleral icterus. Extraocular muscles intact.  HEENT: Head atraumatic, normocephalic. Oropharynx and nasopharynx clear.  NECK:  Supple, no jugular venous distention. No thyroid enlargement, no tenderness.  LUNGS: Normal breath sounds bilaterally, no wheezing, rales,rhonchi or crepitation. No use of accessory muscles of respiration.  Dereased bibasilar breath sounds CARDIOVASCULAR: S1, S2 normal. No  rubs, or gallops.  2/6 systolic murmur is present ABDOMEN: Soft, nontender, nondistended. Bowel sounds present. No organomegaly or mass.  EXTREMITIES: No  cyanosis, or clubbing.  An immobilizer is present to the right leg NEUROLOGIC: Cranial nerves II through XII are intact.  Moving both upper extremities well, confused and not following commands.  Right leg in an immobilizer. Sensation intact. Gait not checked.  PSYCHIATRIC: The patient is alert, not oriented SKIN: No obvious rash, lesion, or ulcer.    LABORATORY PANEL:   CBC Recent Labs  Lab 05/13/19 0459  WBC 12.8*  HGB 8.5*  HCT 29.2*   PLT 298   ------------------------------------------------------------------------------------------------------------------  Chemistries  Recent Labs  Lab 05/11/19 0548  NA 139  K 3.6  CL 100  CO2 26  GLUCOSE 115*  BUN 31*  CREATININE 1.00  CALCIUM 8.9   ------------------------------------------------------------------------------------------------------------------  Cardiac Enzymes No results for input(s): TROPONINI in the last 168 hours. ------------------------------------------------------------------------------------------------------------------  RADIOLOGY:  No results found.  EKG:   Orders placed or performed during the hospital encounter of 05/10/19  . ED EKG  . ED EKG    ASSESSMENT AND PLAN:   85 year old female with severe dementia, depression, diabetes, hypertension brought from assisted living facility with right distal femur fracture.  1.  Right distal femur periprosthetic fracture-concern for possible fall.  Prior history of total knee replacement. -Appreciate orthopedics consult.  Status post surgery , postop day 3 today -Continue pain control.  Physical therapy consulted-but patient unable to participate with therapy given her dementia and inability to follow commands.. -Might need placement to long-term care.  - Patient was wheelchair-bound prior to the hospitalization and was followed with hospice.  But due to concern for falls, family interested in placement at a long-term care facility and continue hospice.  That is appropriate plan for the patient. -Social worker has been consulted.    2.  Hypokalemia-replaced  3.  Acute on chronic anemia-received 1 unit transfusion this hospitalization.  Severe iron deficiency on labs.  Received IV iron in the hospital.  Oral iron at discharge - Hemoglobin is stable  4.  Hypertension-continue home medication.  5.  DVT prophylaxis- Lovenox since hemoglobin is  stable  Updated son over the phone on 05/13/19.  Social worker consult requested Likely plan to discharge to long-term facility with hospice tomorrow  All the records are reviewed and case discussed with Care Management/Social Workerr. Management plans discussed with the patient, family and they are in agreement.  CODE STATUS: Full code  TOTAL TIME TAKING CARE OF THIS PATIENT: 33 minutes.   POSSIBLE D/C tomorrow, DEPENDING ON CLINICAL CONDITION.   Enid Baas M.D on 05/14/2019 at 2:26 PM  Between 7am to 6pm - Pager - [phone]  After 6pm go to [email or web address] - Social research officer, government  [facility] [place] [facility]  Office  [phone]  CC: Primary care physician; [facility]

## 2019-05-14 NOTE — TOC Progression Note (Signed)
Transition of Care [facility]) - Progression Note    Patient Details  Name: IZAMARY FAILS MRN: [id number] Date of Birth: [birth year]  Transition of Care [facility]) CM/SW Contact  Barrie Dunker, RN Phone Number: 05/14/2019, 1:50 PM  Clinical Narrative:    Isabelle Course, the patient's daughter called to speak with the care manager, I explained the difference between assisted living and long term skilled nursing, I explained that the patient will require total care and that the family sets that up.  I explained that unless she has long term care insurance then insurance does not cover the long term care, Isabelle Course stated that she would make some calls and talk to Burke her brother and let me know what facility they plan to send her to, I explained I would be happy to send the patient information to whatever facility they find to accept the patient.  She will call me with information once obtained   Expected Discharge Plan: Long Term Nursing Home Barriers to Discharge: Continued Medical Work up  Expected Discharge Plan and Services Expected Discharge Plan: Long Term Nursing Home   Discharge Planning Services: CM Consult   Living arrangements for the past 2 months: Assisted Living Facility                                       Social Determinants of Health (SDOH) Interventions    Readmission Risk Interventions No flowsheet data found.

## 2019-05-14 NOTE — Care Management Important Message (Signed)
Important Message  Patient Details  Name: Caroline Wilkerson MRN: [id number] Date of Birth: [birth year]   Medicare Important Message Given:  Yes    Olegario Messier A Naiomy Watters 05/14/2019, 11:24 AM

## 2019-05-15 ENCOUNTER — Other Ambulatory Visit: Payer: Self-pay

## 2019-05-15 LAB — BASIC METABOLIC PANEL
Anion gap: 9 (ref 5–15)
BUN: 20 mg/dL (ref 8–23)
CO2: 29 mmol/L (ref 22–32)
Calcium: 8.9 mg/dL (ref 8.9–10.3)
Chloride: 97 mmol/L — ABNORMAL LOW (ref 98–111)
Creatinine, Ser: 0.72 mg/dL (ref 0.44–1.00)
GFR calc Af Amer: >60 mL/min (ref >60)
GFR calc non Af Amer: >60 mL/min (ref >60)
Glucose, Bld: 98 mg/dL (ref 70–99)
Potassium: 3.4 mmol/L — ABNORMAL LOW (ref 3.5–5.1)
Sodium: 135 mmol/L (ref 135–145)

## 2019-05-15 LAB — CBC
HCT: 32.6 % — ABNORMAL LOW (ref 36.0–46.0)
Hemoglobin: 9.4 g/dL — ABNORMAL LOW (ref 12.0–15.0)
MCH: 21.9 pg — ABNORMAL LOW (ref 26.0–34.0)
MCHC: 28.8 g/dL — ABNORMAL LOW (ref 30.0–36.0)
MCV: 76 fL — ABNORMAL LOW (ref 80.0–100.0)
Platelets: 352 10*3/uL (ref 150–400)
RBC: 4.29 MIL/uL (ref 3.87–5.11)
RDW: 23.6 % — ABNORMAL HIGH (ref 11.5–15.5)
WBC: 10.7 10*3/uL — ABNORMAL HIGH (ref 4.0–10.5)
nRBC: 0 % (ref 0.0–0.2)

## 2019-05-15 MED ORDER — TRAMADOL HCL 50 MG PO TABS: 50.0000 mg | ORAL_TABLET | Freq: Four times a day (QID) | ORAL | 0 refills | Status: AC | PRN

## 2019-05-15 MED ORDER — OLANZAPINE 2.5 MG PO TABS: 2.5000 mg | ORAL_TABLET | Freq: Every evening | ORAL | 0 refills | Status: AC | PRN

## 2019-05-15 MED ORDER — ALPRAZOLAM 0.25 MG PO TABS: 0.2500 mg | ORAL_TABLET | Freq: Every day | ORAL | 0 refills | Status: AC

## 2019-05-15 NOTE — Progress Notes (Signed)
Called report to [place] at [facility]. Answered all questions. EMS called for transport instructed to rear entrance.

## 2019-05-15 NOTE — TOC Progression Note (Signed)
Transition of Care [facility]) - Progression Note    Patient Details  Name: Caroline Wilkerson MRN: [id number] Date of Birth: 06/27/1923  Transition of Care [facility]) CM/SW Contact  Barrie Dunker, RN Phone Number: 05/15/2019, 9:59 AM  Clinical Narrative:    Daughter Isabelle Course called and stated that this patient will be discharging to [facility] long term care    Expected Discharge Plan: Long Term Nursing Home Barriers to Discharge: Continued Medical Work up  Expected Discharge Plan and Services Expected Discharge Plan: Long Term Nursing Home   Discharge Planning Services: CM Consult   Living arrangements for the past 2 months: Assisted Living Facility                                       Social Determinants of Health (SDOH) Interventions    Readmission Risk Interventions No flowsheet data found.

## 2019-05-15 NOTE — Discharge Summary (Signed)
[facility] - [place] at [facility]   PATIENT NAME: Caroline Wilkerson    MR#:  [id number]  DATE OF BIRTH:  01/27/1923  DATE OF ADMISSION:  05/10/2019   ADMITTING PHYSICIAN: Campbell Stall, MD  DATE OF DISCHARGE:  05/15/19  PRIMARY CARE PHYSICIAN: [facility]   ADMISSION DIAGNOSIS:   Fall [W19.XXXA] Closed fracture of right femur, unspecified fracture morphology, unspecified portion of femur, initial encounter (HCC) [S72.91XA]  DISCHARGE DIAGNOSIS:   Active Problems:   Displaced fracture of distal epiphysis of right femur (HCC)   SECONDARY DIAGNOSIS:   Past Medical History:  Diagnosis Date  . Alzheimer's dementia (HCC)   . Arthritis   . Cardiomegaly   . Chronic kidney disease   . Depression   . Diabetes mellitus without complication (HCC)   . Fibrocystic breast   . Hearing loss   . Hyperlipidemia   . Hypertension   . Vertigo     HOSPITAL COURSE:   85 year old female with severe dementia, depression, diabetes, hypertension brought from assisted living facility with right distal femur fracture.  1.  Right distal femur periprosthetic fracture-concern for possible fall.  Prior history of total knee replacement. -Appreciate orthopedics consult.  Status post surgery , postop day 4 today -Continue pain control.  Physical therapy consulted-but patient unable to participate with therapy given her dementia and inability to follow commands.. -Might need placement to long-term care.  - Patient was wheelchair-bound prior to the hospitalization and was followed with hospice.  But due to concern for falls, family interested in placement at a long-term care facility and continue hospice.  That is appropriate plan for the patient. -Social worker has been consulted.    2.    Dementia-with occasional agitation.  On Xanax daily.  Zyprexa has been added as needed for agitation  3.  Acute on chronic anemia-received 1 unit transfusion this  hospitalization.  Severe iron deficiency on labs.  Received IV iron in the hospital.  - Hemoglobin is stable  4.  Hypertension-continue home medication.  Patient on triamterene, hydrochlorothiazide  5.  DVT prophylaxis-Ortho recommended aspirin for DVT prophylaxis  Plan to discharge to [facility] for long-term care  DISCHARGE CONDITIONS:   Guarded  CONSULTS OBTAINED:   Treatment Team:  Lyndle Herrlich, MD  DRUG ALLERGIES:   Allergies  Allergen Reactions  . Codeine Diarrhea, Nausea And Vomiting and Other (See Comments)    Nausea   . Hydrocodone Nausea Only   DISCHARGE MEDICATIONS:   Allergies as of 05/15/2019      Reactions   Codeine Diarrhea, Nausea And Vomiting, Other (See Comments)   Nausea   Hydrocodone Nausea Only      Medication List    STOP taking these medications   meclizine 12.5 MG tablet Commonly known as:  ANTIVERT   oxyCODONE 5 MG immediate release tablet Commonly known as:  Oxy IR/ROXICODONE     TAKE these medications   acetaminophen 500 MG tablet Commonly known as:  TYLENOL Take 500 mg by mouth every 6 (six) hours as needed for moderate pain.   ALPRAZolam 0.25 MG tablet Commonly known as:  XANAX Take 1 tablet (0.25 mg total) by mouth daily.   Aspercreme Lidocaine 4 % Ptch Generic drug:  Lidocaine Apply 1 patch topically daily. To affected area of back for 12 hours, off for 12 hours   aspirin EC 81 MG tablet Take 81 mg by mouth daily.   calcium carbonate 600 MG Tabs tablet Commonly known as:  OS-CAL Take 600 mg by mouth 2 (two) times daily with a meal.   citalopram 10 MG tablet Commonly known as:  CELEXA Take 10 mg by mouth daily.   fexofenadine 180 MG tablet Commonly known as:  ALLEGRA Take 180 mg by mouth every morning.   meloxicam 15 MG tablet Commonly known as:  MOBIC Take 0.5 tablets (7.5 mg total) by mouth 2 (two) times daily. What changed:  Another medication with the same name was removed. Continue taking this  medication, and follow the directions you see here.   mirabegron ER 25 MG Tb24 tablet Commonly known as:  Myrbetriq Take 1 tablet (25 mg total) by mouth daily.   mirtazapine 7.5 MG tablet Commonly known as:  REMERON Take 7.5 mg by mouth at bedtime as needed for sleep. What changed:  Another medication with the same name was removed. Continue taking this medication, and follow the directions you see here.   OLANZapine 2.5 MG tablet Commonly known as:  ZyPREXA Take 1 tablet (2.5 mg total) by mouth at bedtime as needed for up to 30 days (agitation).   omeprazole 20 MG capsule Commonly known as:  PRILOSEC Take 20 mg by mouth daily.   traMADol 50 MG tablet Commonly known as:  ULTRAM Take 1 tablet (50 mg total) by mouth every 6 (six) hours as needed.   triamterene-hydrochlorothiazide 75-50 MG tablet Commonly known as:  MAXZIDE Take 1 tablet by mouth daily.        DISCHARGE INSTRUCTIONS:   1.  PCP follow-up 1 to 2 weeks 2.  Orthopedic follow-up in 2 weeks 3.  Hospice at the skilled facility  DIET:   Cardiac diet  ACTIVITY:   Activity as tolerated    OXYGEN:   Home Oxygen: Yes.    Oxygen Delivery: room air  DISCHARGE LOCATION:   nursing home   If you experience worsening of your admission symptoms, develop shortness of breath, life threatening emergency, suicidal or homicidal thoughts you must seek medical attention immediately by calling 911 or calling your MD immediately  if symptoms less severe.  You Must read complete instructions/literature along with all the possible adverse reactions/side effects for all the Medicines you take and that have been prescribed to you. Take any new Medicines after you have completely understood and accpet all the possible adverse reactions/side effects.   Please note  You were cared for by a hospitalist during your hospital stay. If you have any questions about your discharge medications or the care you received while you were in  the hospital after you are discharged, you can call the unit and asked to speak with the hospitalist on call if the hospitalist that took care of you is not available. Once you are discharged, your primary care physician will handle any further medical issues. Please note that NO REFILLS for any discharge medications will be authorized once you are discharged, as it is imperative that you return to your primary care physician (or establish a relationship with a primary care physician if you do not have one) for your aftercare needs so that they can reassess your need for medications and monitor your lab values.    On the day of Discharge:  VITAL SIGNS:   Blood pressure (!) 143/71, pulse 72, temperature 97.8 F (36.6 C), temperature source Oral, resp. rate 16, height 5\' 4"  (1.626 m), weight 63.5 kg, SpO2 95 %.  PHYSICAL EXAMINATION:    GENERAL:  85 y.o.-year-old elderly  patient lying in the bed  with no acute distress.  EYES: Pupils equal, round, reactive to light and accommodation. No scleral icterus. Extraocular muscles intact.  HEENT: Head atraumatic, normocephalic. Oropharynx and nasopharynx clear.  NECK:  Supple, no jugular venous distention. No thyroid enlargement, no tenderness.  LUNGS: Normal breath sounds bilaterally, no wheezing, rales,rhonchi or crepitation. No use of accessory muscles of respiration.  Dereased bibasilar breath sounds CARDIOVASCULAR: S1, S2 normal. No  rubs, or gallops.  2/6 systolic murmur is present ABDOMEN: Soft, nontender, nondistended. Bowel sounds present. No organomegaly or mass.  EXTREMITIES: No  cyanosis, or clubbing.  An immobilizer is present to the right leg NEUROLOGIC: Cranial nerves II through XII are intact.  Moving both upper extremities well, confused and not following commands.  Right leg in an immobilizer. Sensation intact. Gait not checked.  PSYCHIATRIC: The patient is alert, not oriented.  Intermittent agitation SKIN: No obvious rash, lesion, or  ulcer.   DATA REVIEW:   CBC Recent Labs  Lab 05/15/19 0328  WBC 10.7*  HGB 9.4*  HCT 32.6*  PLT 352    Chemistries  Recent Labs  Lab 05/15/19 0328  NA 135  K 3.4*  CL 97*  CO2 29  GLUCOSE 98  BUN 20  CREATININE 0.72  CALCIUM 8.9     Microbiology Results  Results for orders placed or performed during the hospital encounter of 05/10/19  SARS Coronavirus 2 (CEPHEID - Performed in [facility] hospital lab), Hosp Order     Status: None   Collection Time: 05/10/19  2:14 PM  Result Value Ref Range Status   SARS Coronavirus 2 NEGATIVE NEGATIVE Final    Comment: Performed at [facility], [address], [place], [place] [address]  Surgical pcr screen     Status: Abnormal   Collection Time: 05/10/19  6:41 PM  Result Value Ref Range Status   MRSA, PCR POSITIVE (A) NEGATIVE Final    Comment: CRITICAL RESULT CALLED TO, READ BACK BY AND VERIFIED WITH: CALLED TO EDNA GARNETT  05/10/2019 S[facility]    Staphylococcus aureus POSITIVE (A) NEGATIVE Final    Comment: (NOTE) The Xpert SA Assay (FDA approved for NASAL specimens in patients 68 years of age and older), is one component of a comprehensive surveillance program. It is not intended to diagnose infection nor to guide or monitor treatment. Performed at [facility], [address], [place], [place] [address]     RADIOLOGY:  No results found.   Management plans discussed with the patient, family and they are in agreement.  CODE STATUS:     Code Status Orders  (From admission, onward)         Start     Ordered   05/10/19 1747  Full code  Continuous     05/10/19 1746        Code Status History    Date Active Date Inactive Code Status Order ID Comments User Context   12/17/2017 0252 12/19/2017 1627 DNR [id number]  Cammy Copa, MD Inpatient   02/17/2016 1527 02/20/2016 1750 Full Code [id number]  Altamese Dilling, MD Inpatient    Advance Directive Documentation     Most Recent Value  Type  of Advance Directive  Out of facility DNR (pink MOST or yellow form)  Pre-existing out of facility DNR order (yellow form or pink MOST form)  Yellow form placed in chart (order not valid for inpatient use)  "MOST" Form in Place?  -      TOTAL TIME TAKING CARE OF THIS PATIENT:  38 minutes.    Enid Baasadhika Mace Weinberg M.D on 05/15/2019 at 12:45 PM  Between 7am to 6pm - Pager - [phone]  After 6pm go to [email or web address] - Social research officer, governmentpassword EPAS [email or web address]  [facility] [place] [facility]  Office  [phone]  CC: Primary care physician; [facility]   Note: This dictation was prepared with Dragon dictation along with smaller phrase technology. Any transcriptional errors that result from this process are unintentional.

## 2019-05-15 NOTE — TOC Transition Note (Signed)
Transition of Care [facility]) - CM/SW Discharge Note   Patient Details  Name: Caroline Wilkerson MRN: [id number] Date of Birth: [birth year]  Transition [facility]) CM/SW Contact:  Barrie Dunker, RN Phone Number: 05/15/2019, 1:23 PM   Clinical Narrative:    Patient to discharge to [facility] long term care room 703, via EMS, bedside nurse to call when ready,  Isabelle Course the daughter made aware of the DC  Report can be called to [facility] [phone] by bedside nurse DC packet on the chart   Final next level of care: Skilled Nursing Facility Barriers to Discharge: Barriers Resolved   Patient Goals and CMS Choice Patient states their goals for this hospitalization and ongoing recovery are:: son wants to go to long term care CMS Medicare.gov Compare Post Acute Care list provided to:: Patient Choice offered to / list presented to : Adult Children  Discharge Placement                       Discharge Plan and Services   Discharge Planning Services: CM Consult                                 Social Determinants of Health (SDOH) Interventions     Readmission Risk Interventions No flowsheet data found.

## 2019-06-13 DEATH — deceased
